# Patient Record
Sex: Female | Born: 1980 | Hispanic: Yes | Marital: Married | Smoking: Never smoker
Health system: Southern US, Community
[De-identification: ages and names within clinical notes are randomized; demographics above are authoritative.]

## PROBLEM LIST (undated history)

## (undated) DIAGNOSIS — E559 Vitamin D deficiency, unspecified: Secondary | ICD-10-CM

## (undated) DIAGNOSIS — D500 Iron deficiency anemia secondary to blood loss (chronic): Secondary | ICD-10-CM

## (undated) DIAGNOSIS — R7303 Prediabetes: Secondary | ICD-10-CM

## (undated) DIAGNOSIS — K219 Gastro-esophageal reflux disease without esophagitis: Secondary | ICD-10-CM

## (undated) DIAGNOSIS — G4733 Obstructive sleep apnea (adult) (pediatric): Secondary | ICD-10-CM

## (undated) DIAGNOSIS — F32A Depression, unspecified: Secondary | ICD-10-CM

## (undated) DIAGNOSIS — E039 Hypothyroidism, unspecified: Secondary | ICD-10-CM

## (undated) DIAGNOSIS — G43909 Migraine, unspecified, not intractable, without status migrainosus: Secondary | ICD-10-CM

## (undated) DIAGNOSIS — F419 Anxiety disorder, unspecified: Secondary | ICD-10-CM

## (undated) DIAGNOSIS — D259 Leiomyoma of uterus, unspecified: Secondary | ICD-10-CM

## (undated) DIAGNOSIS — M5412 Radiculopathy, cervical region: Secondary | ICD-10-CM

## (undated) HISTORY — PX: COLONOSCOPY: SHX174

---

## 2004-11-26 HISTORY — PX: CHOLECYSTECTOMY: SHX55

## 2012-11-26 HISTORY — PX: DILATION AND CURETTAGE OF UTERUS: SHX78

## 2013-07-22 ENCOUNTER — Emergency Department: Payer: Self-pay | Admitting: Internal Medicine

## 2013-07-22 LAB — CBC
HCT: 34.9 % — ABNORMAL LOW (ref 35.0–47.0)
HGB: 11.8 g/dL — ABNORMAL LOW (ref 12.0–16.0)
MCH: 26.6 pg (ref 26.0–34.0)
MCHC: 33.9 g/dL (ref 32.0–36.0)
MCV: 79 fL — ABNORMAL LOW (ref 80–100)
RBC: 4.44 10*6/uL (ref 3.80–5.20)
RDW: 15.1 % — ABNORMAL HIGH (ref 11.5–14.5)
WBC: 10.7 10*3/uL (ref 3.6–11.0)

## 2013-07-22 LAB — COMPREHENSIVE METABOLIC PANEL
Albumin: 3.3 g/dL — ABNORMAL LOW (ref 3.4–5.0)
Alkaline Phosphatase: 65 U/L (ref 50–136)
Calcium, Total: 8.7 mg/dL (ref 8.5–10.1)
EGFR (Non-African Amer.): >60
Osmolality: 268 (ref 275–301)
Potassium: 3.7 mmol/L (ref 3.5–5.1)
Sodium: 134 mmol/L — ABNORMAL LOW (ref 136–145)

## 2013-07-22 LAB — URINALYSIS, COMPLETE
Blood: NEGATIVE
Ketone: NEGATIVE
Ph: 6 (ref 4.5–8.0)
Protein: 30
RBC,UR: 6 /HPF (ref 0–5)
Specific Gravity: 1.027 (ref 1.003–1.030)

## 2013-07-22 LAB — HCG, QUANTITATIVE, PREGNANCY: Beta Hcg, Quant.: 53313 m[IU]/mL — ABNORMAL HIGH

## 2013-07-24 LAB — URINE CULTURE

## 2013-08-17 ENCOUNTER — Encounter: Payer: Self-pay | Admitting: Obstetrics and Gynecology

## 2013-09-07 ENCOUNTER — Encounter: Payer: Self-pay | Admitting: Maternal and Fetal Medicine

## 2013-09-30 ENCOUNTER — Emergency Department: Payer: Self-pay | Admitting: Emergency Medicine

## 2013-09-30 LAB — CBC
HGB: 10.5 g/dL — ABNORMAL LOW (ref 12.0–16.0)
MCHC: 34.7 g/dL (ref 32.0–36.0)
MCV: 80 fL (ref 80–100)
Platelet: 305 10*3/uL (ref 150–440)
RDW: 13.4 % (ref 11.5–14.5)

## 2013-09-30 LAB — COMPREHENSIVE METABOLIC PANEL
Albumin: 2.6 g/dL — ABNORMAL LOW (ref 3.4–5.0)
Anion Gap: 7 (ref 7–16)
BUN: 10 mg/dL (ref 7–18)
Bilirubin,Total: 0.2 mg/dL (ref 0.2–1.0)
Co2: 24 mmol/L (ref 21–32)
EGFR (African American): >60
Osmolality: 272 (ref 275–301)
Potassium: 3.4 mmol/L — ABNORMAL LOW (ref 3.5–5.1)
SGOT(AST): 22 U/L (ref 15–37)
SGPT (ALT): 30 U/L (ref 12–78)
Sodium: 136 mmol/L (ref 136–145)

## 2013-09-30 LAB — URINALYSIS, COMPLETE
Glucose,UR: 50 mg/dL (ref 0–75)
Nitrite: NEGATIVE
Protein: NEGATIVE
WBC UR: 1 /HPF (ref 0–5)

## 2013-09-30 LAB — LIPASE, BLOOD: Lipase: 116 U/L (ref 73–393)

## 2013-10-01 LAB — GC/CHLAMYDIA PROBE AMP

## 2013-10-01 LAB — WET PREP, GENITAL

## 2013-10-03 LAB — URINE CULTURE

## 2013-10-05 ENCOUNTER — Encounter: Payer: Self-pay | Admitting: Maternal and Fetal Medicine

## 2013-10-19 ENCOUNTER — Encounter: Payer: Self-pay | Admitting: Maternal and Fetal Medicine

## 2014-11-04 ENCOUNTER — Emergency Department: Payer: Self-pay | Admitting: Emergency Medicine

## 2014-11-26 HISTORY — PX: TUBAL LIGATION: SHX77

## 2015-03-18 NOTE — Consult Note (Signed)
Referral Information:  Reason for Referral Twin gestation with  right ovarian mass and family h/o ovarian cancer   Referring Physician Dr Glennon Mac, [facility]   Prenatal Hx 34 yo G3 p2002 latina engaged female at 44 w 5 d by 6 weeks scan with twins and right complex ovarian mass . First scan suspicious for Mono/Mono , f/u at [facility] c/w Mono /di as is scan today .  Scan today has a normal appearing right ovary with a small cyst that appears c/wcorpus luteum cyst. Pregnancy is an accepted surprise. She has had nausea coping with Diclegis and Zofran. Migraine yesterday took ibuprofen , constipation uses miralax   Past Obstetrical Hx 2001 spontaneous vaginal delivery female 7+ lbs 2006 spontaneous vaginal delivery female 8 lbs - GB surgery at about 20 weeks   Home Medications: Medication Instructions Status  Diclegis 10 mg-10 mg oral delayed release tablet 1  orally once a day Active   Allergies:   No Known Allergies:   Vital Signs/Notes:  Nursing Vital Signs: **Vital Signs.:   22-Sep-14 11:37  Vital Signs Type Routine; in Perinatal Clinic  Temperature Temperature (F) 98.2  Celsius 36.7  Pulse Pulse 91  Respirations Respirations 18  Systolic BP Systolic BP 259  Diastolic BP (mmHg) Diastolic BP (mmHg) 69  Mean BP 86  Pulse Ox % Pulse Ox % 98  Oxygen Delivery Room Air/ 21 %   Perinatal Consult:  Past Medical History cont'd migraines - one per year - had one yesterday - uses single pill outside of pregnancy , immetrex? 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 I reviewed increased risk of twins including preterm delivery, growth issues, hyperemesis , delivery issues ,preeeclampsia and twin to twin transfusion syndrome . 2 Desires first trimester screen - ordered here in 3 weeks (though may be done at primary ob ) 3 Q 2 week scans for TTTS 16 to 32 weeks  4 Q 4week growth scans in third trimester to assess growth . 5 Antenatal testing at 32-34 weeks  6 Delivery a t 37-38 w if stable   Plan:  Genetic Counseling yes, with NT   Prenatal Diagnosis Options First trimester, Level II [place]   Ultrasound at what gestational ages Monthly > 28 weeks, plus q 2weeks 16-32 weeks   Antepartum Testing Starting at 32 weeks, Weekly   Delivery Mode depends on position   Additional Testing Folate/prenatal vitamins   Delivery at what gestational age 25-38    Total Time Spent with Patient 30 minutes   >50% of visit spent in couseling/coordination of care yes   Office Use Only [address]  Level 2 (86min) NEW office consult exp prob focused   Coding Description: MULTIPLE GESTATION INDICATION(S).   Twin gestation: mono/di.  Electronic Signatures: Lehman Prom,  Micah Noel (MD)  (Signed 22-Sep-14 15:58)  Authored: Referral, Home Medications, Allergies, Vital Signs/Notes, Consult, Exam, Impression, Plan, Billing, Coding Description   Last Updated: 22-Sep-14 15:58 by Sharyn Creamer (MD)

## 2016-11-26 HISTORY — PX: ESOPHAGOGASTRODUODENOSCOPY: SHX1529

## 2017-04-18 ENCOUNTER — Ambulatory Visit: Payer: BLUE CROSS/BLUE SHIELD | Admitting: Family Medicine

## 2017-05-21 ENCOUNTER — Ambulatory Visit: Payer: BLUE CROSS/BLUE SHIELD | Admitting: Family Medicine

## 2018-07-11 ENCOUNTER — Ambulatory Visit
Admission: RE | Admit: 2018-07-11 | Discharge: 2018-07-11 | Disposition: A | Discharge status: Home / Self Care | Payer: BLUE CROSS/BLUE SHIELD | Source: Ambulatory Visit | Attending: Adult Health | Admitting: Adult Health

## 2018-07-11 ENCOUNTER — Ambulatory Visit
Admission: RE | Admit: 2018-07-11 | Discharge: 2018-07-11 | Disposition: A | Discharge status: Home / Self Care | Payer: BLUE CROSS/BLUE SHIELD | Source: Ambulatory Visit | Attending: Family Medicine | Admitting: Family Medicine

## 2018-07-11 ENCOUNTER — Other Ambulatory Visit: Payer: Self-pay | Admitting: Adult Health

## 2018-07-11 DIAGNOSIS — M129 Arthropathy, unspecified: Secondary | ICD-10-CM | POA: Insufficient documentation

## 2018-07-11 DIAGNOSIS — M5440 Lumbago with sciatica, unspecified side: Secondary | ICD-10-CM

## 2018-07-11 DIAGNOSIS — M545 Low back pain: Secondary | ICD-10-CM | POA: Diagnosis not present

## 2018-07-11 DIAGNOSIS — M419 Scoliosis, unspecified: Secondary | ICD-10-CM | POA: Diagnosis not present

## 2018-07-11 DIAGNOSIS — M5430 Sciatica, unspecified side: Secondary | ICD-10-CM | POA: Diagnosis not present

## 2018-08-20 ENCOUNTER — Ambulatory Visit
Admission: RE | Admit: 2018-08-20 | Discharge: 2018-08-20 | Disposition: A | Discharge status: Home / Self Care | Payer: BLUE CROSS/BLUE SHIELD | Source: Ambulatory Visit | Attending: Family Medicine | Admitting: Family Medicine

## 2018-08-20 ENCOUNTER — Other Ambulatory Visit: Payer: Self-pay | Admitting: Family Medicine

## 2018-08-20 DIAGNOSIS — M4722 Other spondylosis with radiculopathy, cervical region: Secondary | ICD-10-CM | POA: Insufficient documentation

## 2018-08-20 DIAGNOSIS — M5412 Radiculopathy, cervical region: Secondary | ICD-10-CM

## 2018-08-20 DIAGNOSIS — E039 Hypothyroidism, unspecified: Secondary | ICD-10-CM | POA: Insufficient documentation

## 2019-03-19 ENCOUNTER — Other Ambulatory Visit: Payer: Self-pay | Admitting: Student

## 2019-03-19 DIAGNOSIS — M5412 Radiculopathy, cervical region: Secondary | ICD-10-CM

## 2019-08-30 ENCOUNTER — Emergency Department: Payer: BC Managed Care – PPO

## 2019-08-30 ENCOUNTER — Other Ambulatory Visit: Payer: Self-pay

## 2019-08-30 ENCOUNTER — Encounter: Payer: Self-pay | Admitting: Emergency Medicine

## 2019-08-30 ENCOUNTER — Emergency Department
Admission: EM | Admit: 2019-08-30 | Discharge: 2019-08-30 | Disposition: A | Discharge status: Home / Self Care | Payer: BC Managed Care – PPO | Attending: Emergency Medicine | Admitting: Emergency Medicine

## 2019-08-30 DIAGNOSIS — J189 Pneumonia, unspecified organism: Secondary | ICD-10-CM

## 2019-08-30 DIAGNOSIS — J181 Lobar pneumonia, unspecified organism: Secondary | ICD-10-CM | POA: Diagnosis not present

## 2019-08-30 DIAGNOSIS — U071 COVID-19: Secondary | ICD-10-CM | POA: Diagnosis not present

## 2019-08-30 LAB — TROPONIN I (HIGH SENSITIVITY): Troponin I (High Sensitivity): 3 ng/L (ref <18)

## 2019-08-30 LAB — CBC WITH DIFFERENTIAL/PLATELET
Abs Immature Granulocytes: 0.02 10*3/uL (ref 0.00–0.07)
Basophils Absolute: 0 10*3/uL (ref 0.0–0.1)
Basophils Relative: 0 %
Eosinophils Absolute: 0.1 10*3/uL (ref 0.0–0.5)
Eosinophils Relative: 1 %
HCT: 38.3 % (ref 36.0–46.0)
Hemoglobin: 12.8 g/dL (ref 12.0–15.0)
Immature Granulocytes: 0 %
Lymphocytes Relative: 35 %
Lymphs Abs: 2.1 10*3/uL (ref 0.7–4.0)
MCH: 27.5 pg (ref 26.0–34.0)
MCHC: 33.4 g/dL (ref 30.0–36.0)
MCV: 82.2 fL (ref 80.0–100.0)
Monocytes Absolute: 0.6 10*3/uL (ref 0.1–1.0)
Monocytes Relative: 11 %
Neutro Abs: 3.2 10*3/uL (ref 1.7–7.7)
Neutrophils Relative %: 53 %
Platelets: 288 10*3/uL (ref 150–400)
RBC: 4.66 MIL/uL (ref 3.87–5.11)
RDW: 12.2 % (ref 11.5–15.5)
WBC: 6 10*3/uL (ref 4.0–10.5)
nRBC: 0 % (ref 0.0–0.2)

## 2019-08-30 LAB — COMPREHENSIVE METABOLIC PANEL
ALT: 21 U/L (ref 0–44)
AST: 20 U/L (ref 15–41)
Albumin: 4.2 g/dL (ref 3.5–5.0)
Alkaline Phosphatase: 64 U/L (ref 38–126)
Anion gap: 11 (ref 5–15)
BUN: 11 mg/dL (ref 6–20)
CO2: 25 mmol/L (ref 22–32)
Calcium: 9.2 mg/dL (ref 8.9–10.3)
Chloride: 101 mmol/L (ref 98–111)
Creatinine, Ser: 0.74 mg/dL (ref 0.44–1.00)
GFR calc Af Amer: >60 mL/min (ref >60)
GFR calc non Af Amer: >60 mL/min (ref >60)
Glucose, Bld: 91 mg/dL (ref 70–99)
Potassium: 3.8 mmol/L (ref 3.5–5.1)
Sodium: 137 mmol/L (ref 135–145)
Total Bilirubin: 0.7 mg/dL (ref 0.3–1.2)
Total Protein: 8.1 g/dL (ref 6.5–8.1)

## 2019-08-30 MED ORDER — AZITHROMYCIN 250 MG PO TABS: ORAL_TABLET | ORAL | 0 refills | Status: DC

## 2019-08-30 MED ORDER — METHYLPREDNISOLONE 4 MG PO TBPK: ORAL_TABLET | ORAL | 0 refills | Status: DC

## 2019-08-30 NOTE — ED Triage Notes (Signed)
Pt to ED via POV stating that she is COVID positive, pt states that she is having shortness of breath and some pain in her chest. Pt is in NAD, no respiratory distress is noted, pt is speaking in complete sentences without difficulty. SpO2 99% on room air.

## 2019-08-30 NOTE — ED Provider Notes (Signed)
[facility] Emergency Department Provider Note  ____________________________________________   First MD Initiated Contact with Patient 08/30/19 1124     (approximate)  I have reviewed the triage vital signs and the nursing notes.   HISTORY  Chief Complaint COVID    HPI Valerie Pearson is a 38 y.o. female presents emergency department stating she tested positive for covid on August 20, 2019.  She states that she has now started to have some chest pain and shortness of breath associated with COVID.  She denies any nausea or vomiting.  No swelling in the extremities.    History reviewed. No pertinent past medical history.  There are no active problems to display for this patient.   History reviewed. No pertinent surgical history.  Prior to Admission medications   Medication Sig Start Date End Date Taking? Authorizing Provider  azithromycin (ZITHROMAX Z-PAK) 250 MG tablet 2 pills today then 1 pill a day for 4 days 08/30/19   Caryn Section Linden Dolin, PA-C  methylPREDNISolone (MEDROL DOSEPAK) 4 MG TBPK tablet Take 6 pills on day one then decrease by 1 pill each day 08/30/19   Versie Starks, PA-C    Allergies Iodinated diagnostic agents  No family history on file.  Social History Social History   Tobacco Use  . Smoking status: Not on file  Substance Use Topics  . Alcohol use: Not on file  . Drug use: Not on file    Review of Systems  Constitutional: No fever/chills Eyes: No visual changes. ENT: No sore throat. Respiratory: Positive cough Cardiovascular: Positive chest pain shortness of breath Genitourinary: Negative for dysuria. Musculoskeletal: Negative for back pain. Skin: Negative for rash.    ____________________________________________   PHYSICAL EXAM:  VITAL SIGNS: ED Triage Vitals  Enc Vitals Group     BP 08/30/19 1056 115/72     Pulse Rate 08/30/19 1056 89     Resp 08/30/19 1056 16     Temp 08/30/19 1056 98.7 F (37.1  C)     Temp Source 08/30/19 1056 Oral     SpO2 08/30/19 1056 99 %     Weight 08/30/19 1057 158 lb (71.7 kg)     Height 08/30/19 1057 5' 1"  (1.549 m)     Head Circumference --      Peak Flow --      Pain Score 08/30/19 1057 0     Pain Loc --      Pain Edu? --      Excl. in [employer]? --     Constitutional: Alert and oriented. Well appearing and in no acute distress. Eyes: Conjunctivae are normal.  Head: Atraumatic. Nose: No congestion/rhinnorhea. Mouth/Throat: Mucous membranes are moist.   Neck:  supple no lymphadenopathy noted Cardiovascular: Normal rate, regular rhythm. Heart sounds are normal Respiratory: Normal respiratory effort.  No retractions, lungs c t a  GU: deferred Musculoskeletal: FROM all extremities, warm and well perfused Neurologic:  Normal speech and language.  Skin:  Skin is warm, dry and intact. No rash noted. Psychiatric: Mood and affect are normal. Speech and behavior are normal.  ____________________________________________   LABS (all labs ordered are listed, but only abnormal results are displayed)  Labs Reviewed  COMPREHENSIVE METABOLIC PANEL  CBC WITH DIFFERENTIAL/PLATELET  TROPONIN I (HIGH SENSITIVITY)   ____________________________________________   ____________________________________________  RADIOLOGY  Chest x-ray shows infiltrate/infection in the left lower lung base  ____________________________________________   PROCEDURES  Procedure(s) performed: EKG is normal sinus rhythm  Procedures    ____________________________________________  INITIAL IMPRESSION / ASSESSMENT AND PLAN / ED COURSE  Pertinent labs & imaging results that were available during my care of the patient were reviewed by me and considered in my medical decision making (see chart for details).   Patient is a 38 year old female presents emergency department complaining of chest pain/shortness of breath.  Patient test positive for COVID on August 20, 2019.  See  HPI  Physical exam patient's vitals are normal.  Patient appears nontoxic and is not in any acute respiratory distress.  Lungs are clear to all station.  Chest x-ray shows a left lung infection. CBC, met C, troponin, and EKG were ordered   CBC is normal, EKG shows normal sinus rhythm Troponin and metabolic panel are both normal  Explained all the findings to the patient.  Due to the infection noted in the left lung empirically treated her with a Z-Pak and steroid pack.  She is to follow-up with her regular doctor if not better in 3 days.  Return emergency department worsening.  She states she understands will comply.  She is discharged in stable condition.  Dayne Dekay was evaluated in Emergency Department on 08/30/2019 for the symptoms described in the history of present illness. She was evaluated in the context of the global COVID-19 pandemic, which necessitated consideration that the patient might be at risk for infection with the SARS-CoV-2 virus that causes COVID-19. Institutional protocols and algorithms that pertain to the evaluation of patients at risk for COVID-19 are in a state of rapid change based on information released by regulatory bodies including the CDC and federal and state organizations. These policies and algorithms were followed during the patient's care in the ED.   As part of my medical decision making, I reviewed the following data within the [place] notes reviewed and incorporated, Labs reviewed see above, EKG interpreted NSR, Old chart reviewed, Radiograph reviewed chest x-ray shows infection in the left lung, Notes from prior ED visits and [place] Controlled Substance Database  ____________________________________________   FINAL CLINICAL IMPRESSION(S) / ED DIAGNOSES  Final diagnoses:  COVID-19  Pneumonia of left lower lobe due to infectious organism      NEW MEDICATIONS STARTED DURING THIS VISIT:  Discharge Medication List as of  08/30/2019  2:55 PM    START taking these medications   Details  azithromycin (ZITHROMAX Z-PAK) 250 MG tablet 2 pills today then 1 pill a day for 4 days, Normal    methylPREDNISolone (MEDROL DOSEPAK) 4 MG TBPK tablet Take 6 pills on day one then decrease by 1 pill each day, Normal         Note:  This document was prepared using Dragon voice recognition software and may include unintentional dictation errors.    Versie Starks, PA-C 08/30/19 1550    Arta Silence, MD 08/30/19 1606

## 2019-08-30 NOTE — Discharge Instructions (Signed)
Follow-up with your regular doctor if not better in 3 days.  Return emergency department worsening.  All of your lab work, EKG were normal.  Your chest x-ray did show some infection in the left lung.  I have sent in a prescription to your pharmacy.  Please have this filled and take medication as prescribed.

## 2020-02-29 ENCOUNTER — Other Ambulatory Visit: Payer: Self-pay | Admitting: Family Medicine

## 2020-02-29 DIAGNOSIS — M79609 Pain in unspecified limb: Secondary | ICD-10-CM

## 2020-03-04 ENCOUNTER — Ambulatory Visit
Admission: RE | Admit: 2020-03-04 | Discharge: 2020-03-04 | Disposition: A | Discharge status: Home / Self Care | Payer: BC Managed Care – PPO | Source: Ambulatory Visit | Attending: Family Medicine | Admitting: Family Medicine

## 2020-03-04 ENCOUNTER — Other Ambulatory Visit: Payer: Self-pay

## 2020-03-04 DIAGNOSIS — M79609 Pain in unspecified limb: Secondary | ICD-10-CM | POA: Diagnosis not present

## 2020-04-02 IMAGING — CR DG LUMBAR SPINE COMPLETE W/ BEND
1 series · 7 of 7 positions shown · non-contrast
Comparison: None.

CLINICAL DATA: Back pain for 1 week.

EXAM:
LUMBAR SPINE - COMPLETE WITH BENDING VIEWS

[Series 1: dg lumbar spine complete w/bend 6+v · 0.14mm/px · 7 of 7 slices shown]
[im 1/7]
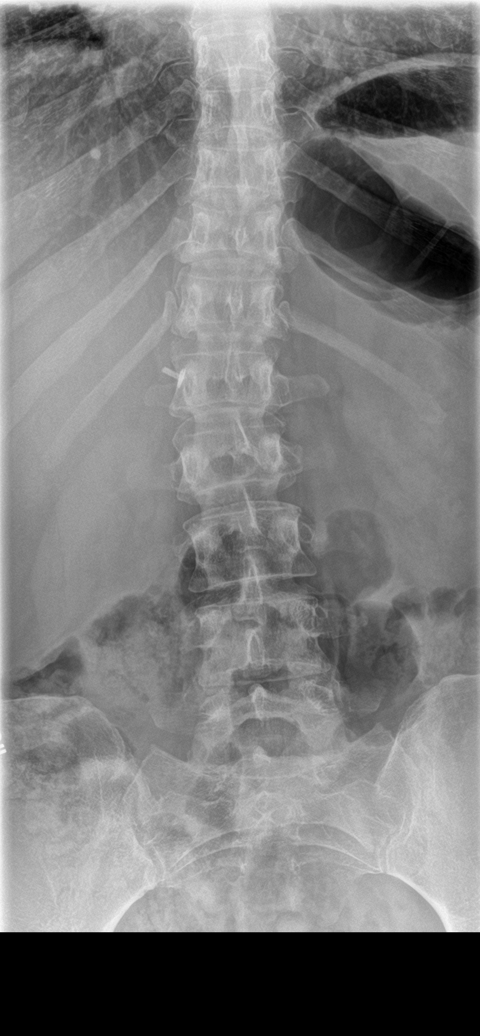
[im 2/7]
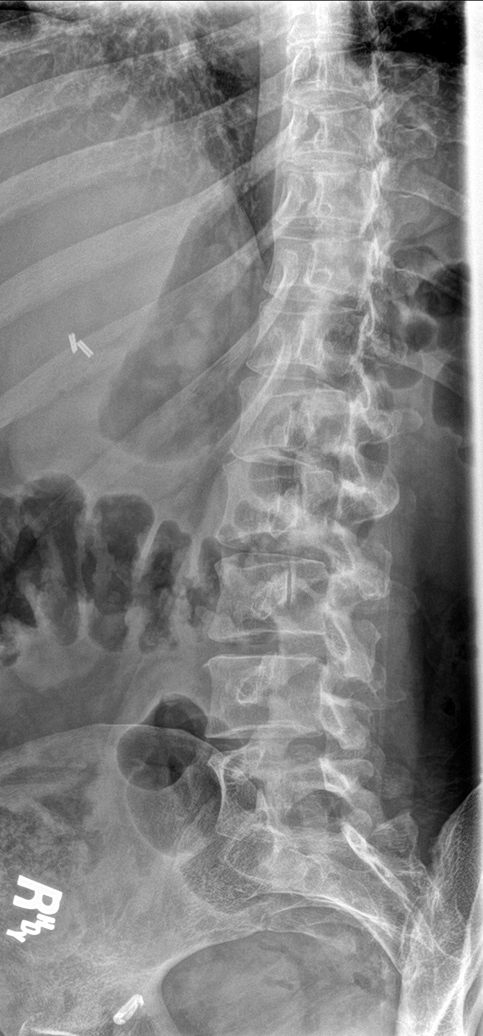
[im 3/7]
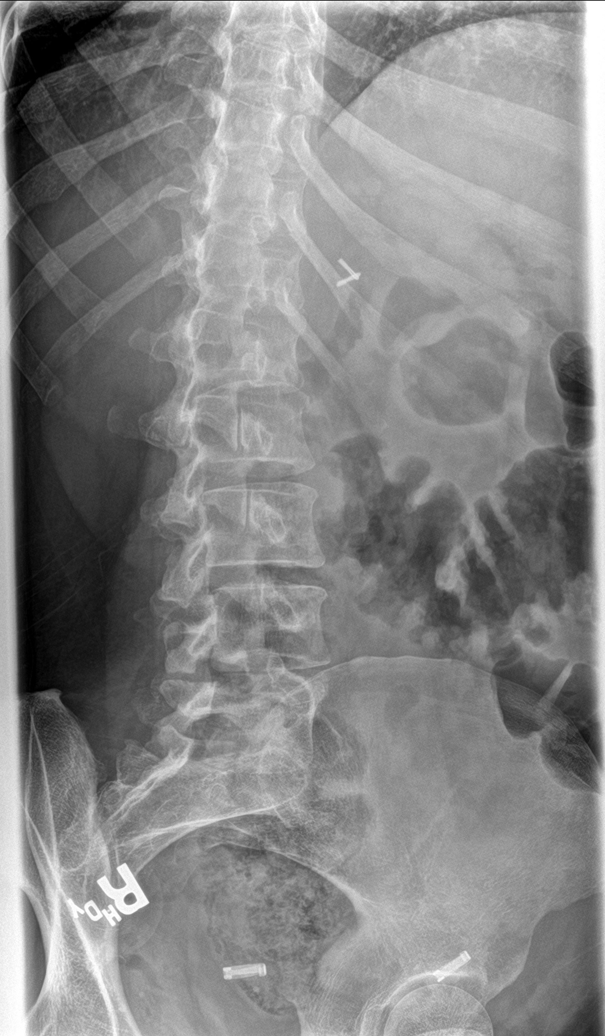
[im 4/7]
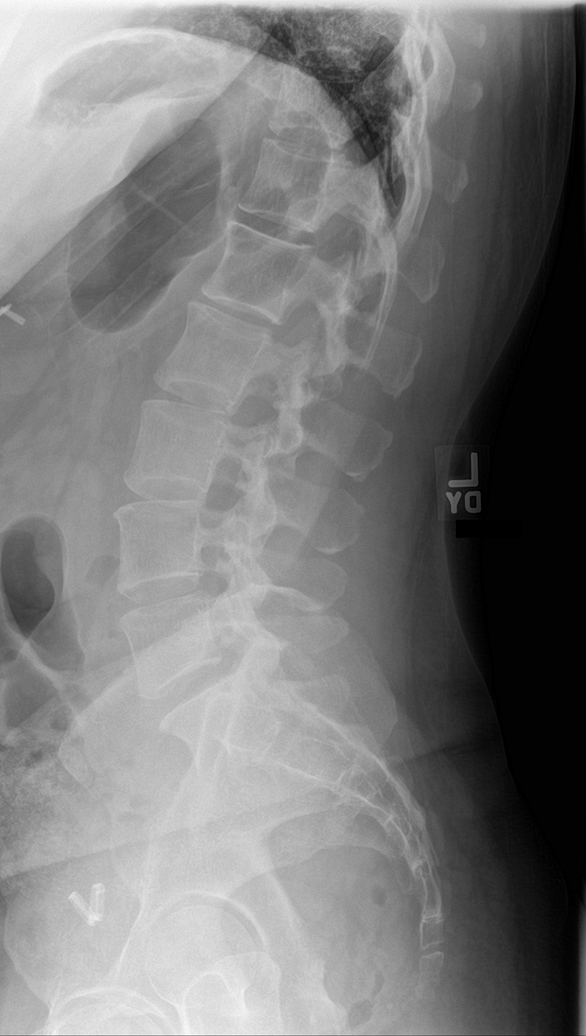
[im 5/7]
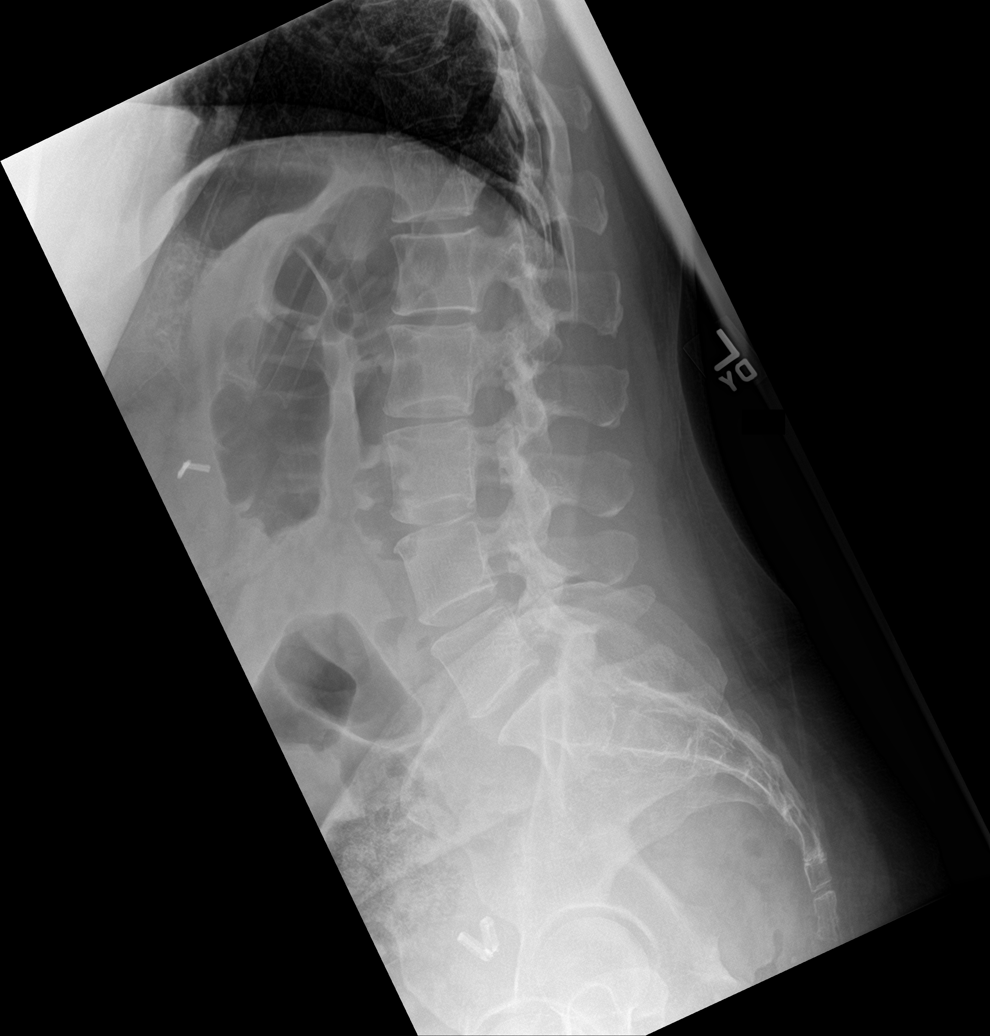
[im 6/7]
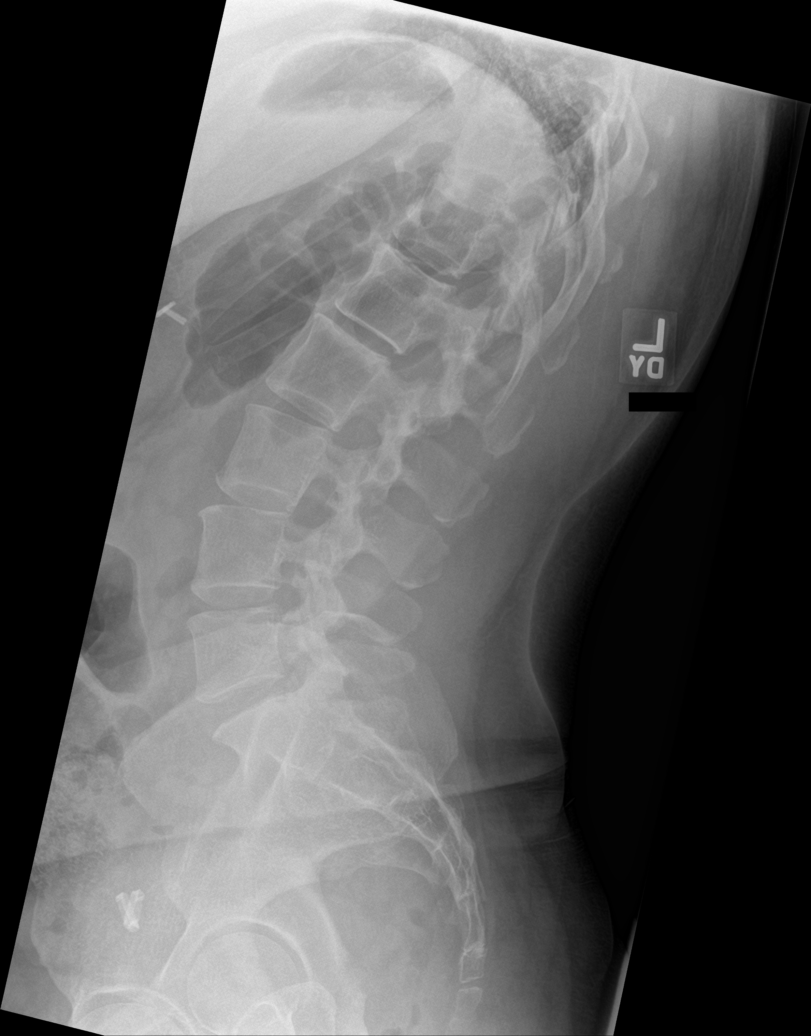
[im 7/7]
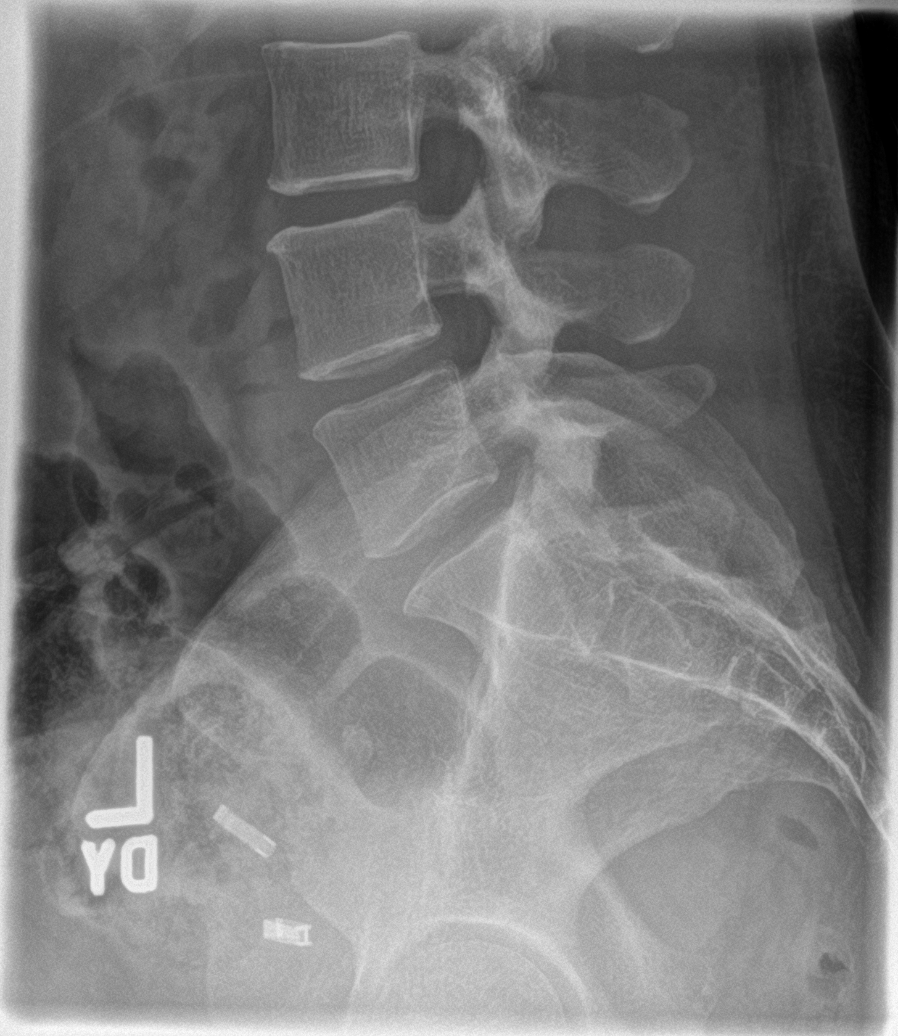

[7 of 7 positions shown; findings below may reference images not displayed]

FINDINGS: There is no evidence of lumbar spine fracture. Exaggerated lordosis
and mild dextroconvex scoliosis. Intervertebral disc spaces are
maintained. Mild posterior facet arthropathy in the lower
lumbosacral spine.

Tubal ligation and cholecystectomy clips noted.
IMPRESSION: Posterior facet arthropathy in the lower lumbosacral spine.

Exaggerated lordosis and mild dextroconvex scoliosis.

## 2020-05-12 IMAGING — CR DG CERVICAL SPINE 2 OR 3 VIEWS
4 series · 4 of 4 positions shown · non-contrast
Comparison: None.

CLINICAL DATA: Chronic neck pain

EXAM:
CERVICAL SPINE - 3 VIEW

[c-spine lat]
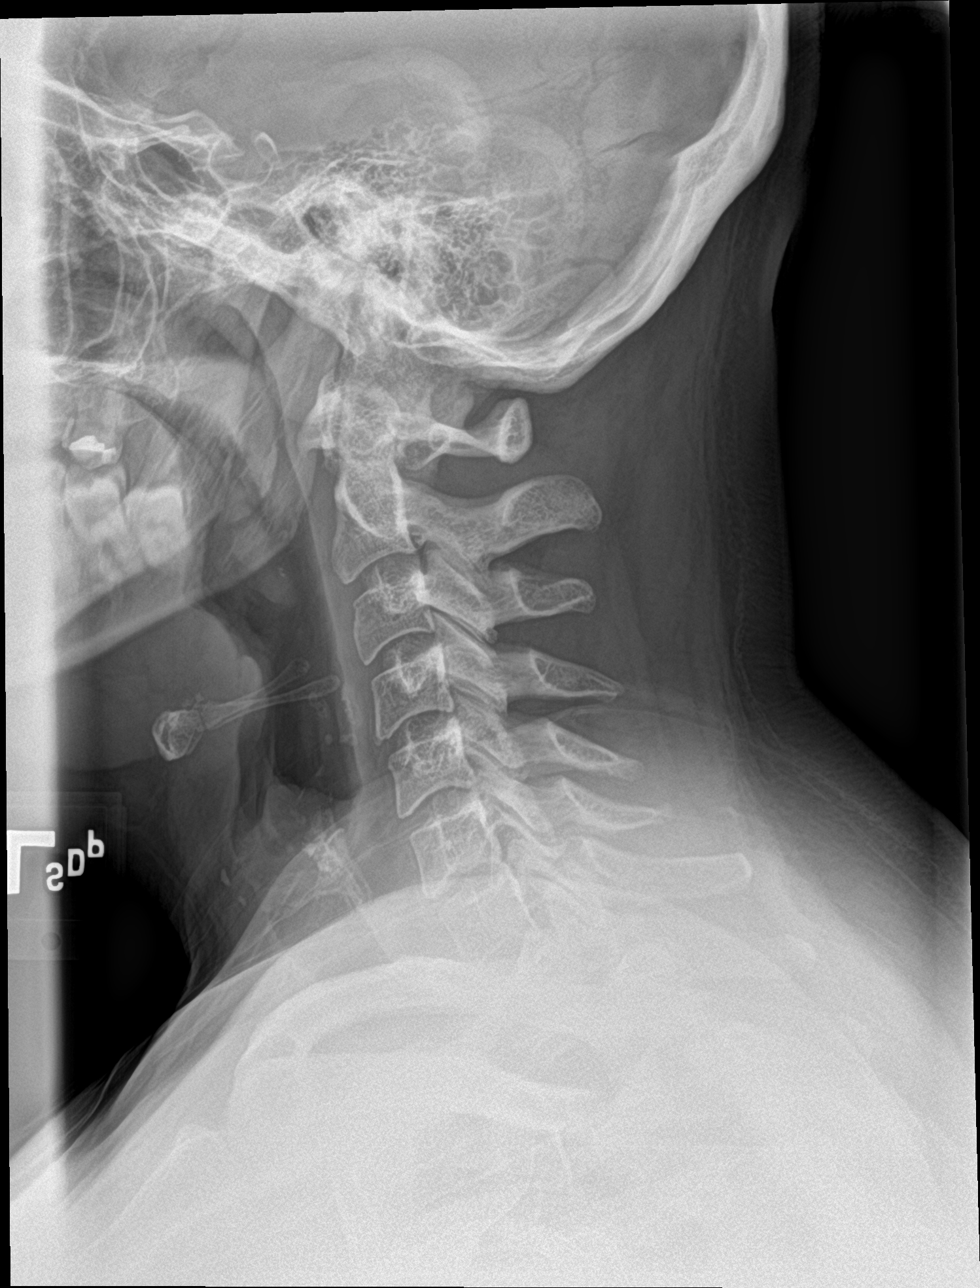

[c-spine open mouth (1 of 2)]
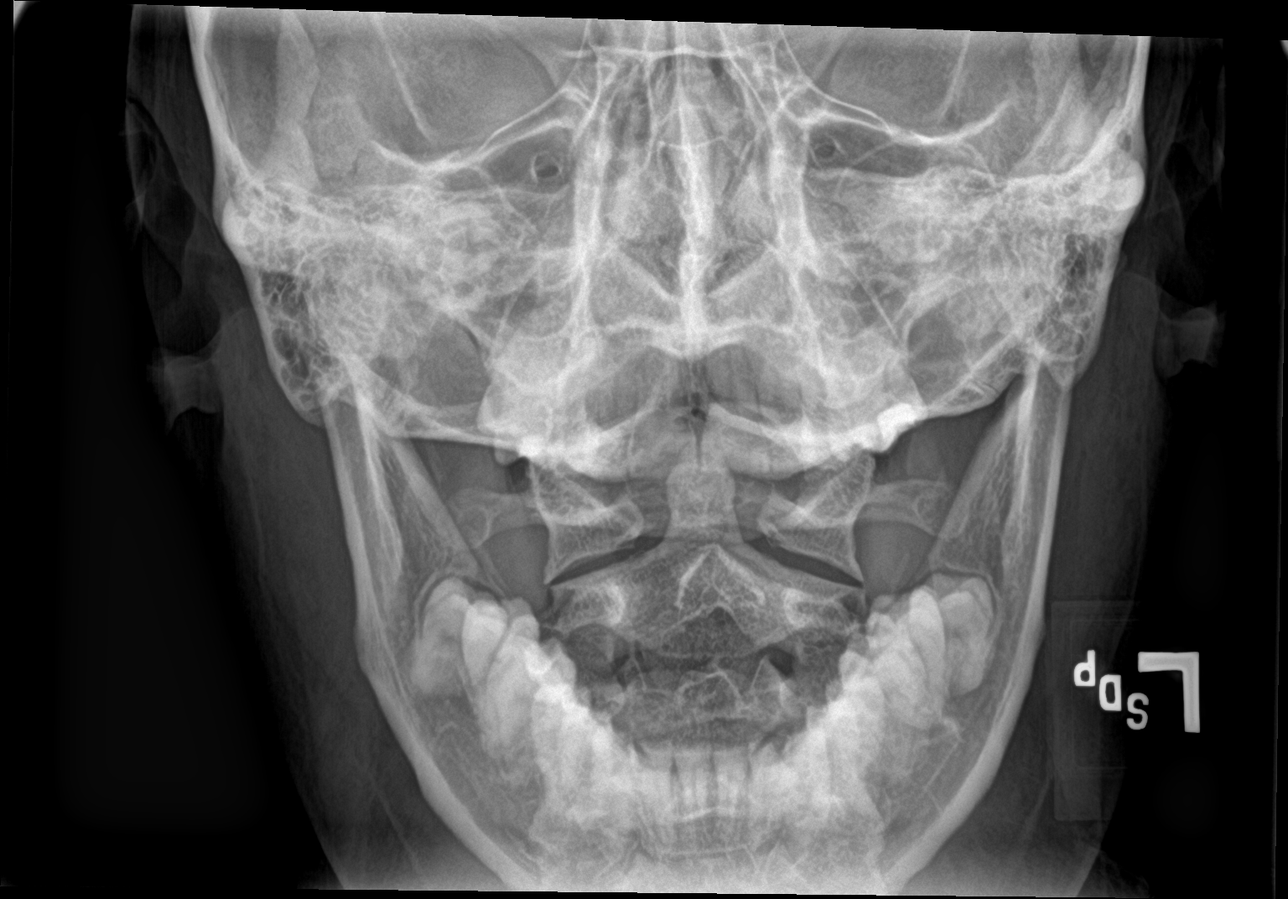

[c-spine open mouth (2 of 2)]
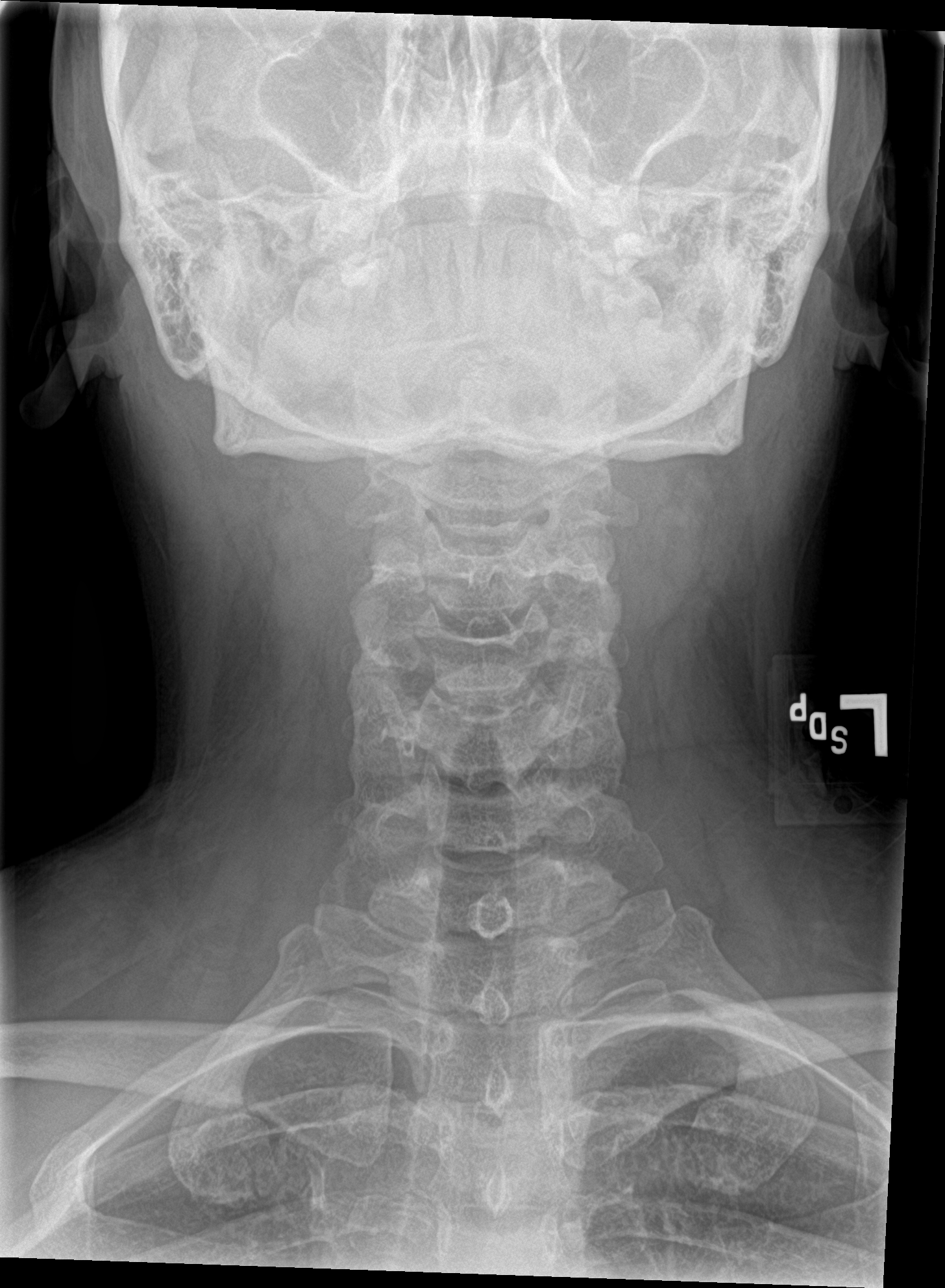

[c-spine swimmers]
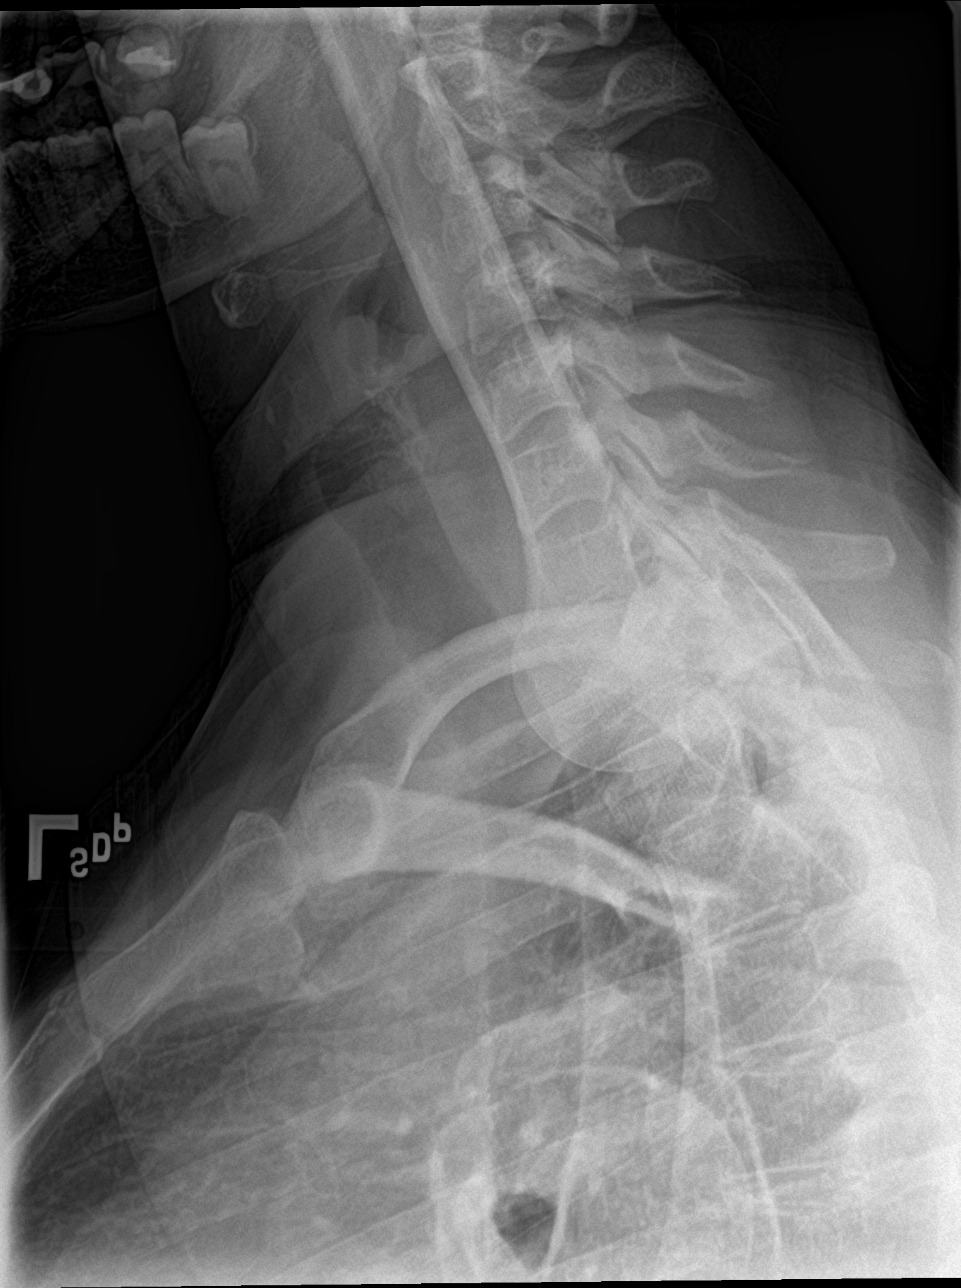

[4 of 4 positions shown; findings below may reference images not displayed]

FINDINGS: Seven cervical segments are well visualized. Minimal osteophytic
changes are noted at C4-5. The odontoid is within normal limits. No
acute fracture or acute facet abnormality is noted. No soft tissue
changes are seen.
IMPRESSION: No acute abnormality noted.  Mild degenerative changes are noted.

## 2020-05-18 ENCOUNTER — Other Ambulatory Visit: Payer: Self-pay | Admitting: Surgery

## 2020-06-02 ENCOUNTER — Other Ambulatory Visit: Payer: Self-pay

## 2020-06-02 ENCOUNTER — Other Ambulatory Visit
Admission: RE | Admit: 2020-06-02 | Discharge: 2020-06-02 | Disposition: A | Discharge status: Home / Self Care | Payer: BC Managed Care – PPO | Source: Ambulatory Visit | Attending: Surgery | Admitting: Surgery

## 2020-06-02 ENCOUNTER — Encounter: Payer: Self-pay | Admitting: *Deleted

## 2020-06-02 HISTORY — DX: Leiomyoma of uterus, unspecified: D25.9

## 2020-06-02 HISTORY — DX: Anxiety disorder, unspecified: F41.9

## 2020-06-02 HISTORY — DX: Migraine, unspecified, not intractable, without status migrainosus: G43.909

## 2020-06-02 HISTORY — DX: Depression, unspecified: F32.A

## 2020-06-02 HISTORY — DX: Hypothyroidism, unspecified: E03.9

## 2020-06-02 NOTE — Patient Instructions (Addendum)
COVID TESTING Date: Monday, July 12 Testing site:  [place] [address] Thru Hours:  1:75 am - 1:00 pm Once you are tested, you are asked to stay quarantined (avoiding public places) until after your surgery.   Your procedure is scheduled on: Wednesday, July 14 Report to Day Surgery on the 2nd floor of the [facility]. To find out your arrival time, please call [phone] between 1PM - 3PM on: Tuesday, July 13  REMEMBER: Instructions that are not followed completely may result in serious medical risk, up to and including death; or upon the discretion of your surgeon and anesthesiologist your surgery may need to be rescheduled.  Do not eat food after midnight the night before surgery.  No gum chewing, lozengers or hard candies.  You may however, drink CLEAR liquids up to 2 hours before you are scheduled to arrive for your surgery. Do not drink anything within 2 hours of your scheduled arrival time.  Clear liquids include: - water  - apple juice without pulp - gatorade (not RED) - black coffee or tea (Do NOT add milk or creamers to the coffee or tea) Do NOT drink anything that is not on this list.  ENSURE PRE-SURGERY CARBOHYDRATE DRINK:  Complete drinking 2 hours prior to scheduled arrival time.  DO NOT TAKE ANY MEDICATIONS THE MORNING OF SURGERY  Stop Anti-inflammatories (NSAIDS) such as Advil, Aleve, Ibuprofen, Motrin, Naproxen, Naprosyn and Aspirin based products such as Excedrin, Goodys Powder, BC Powder. (May take Tylenol or Acetaminophen if needed.)  Stop ANY OVER THE COUNTER supplements until after surgery.  No Alcohol for 24 hours before or after surgery.  On the morning of surgery brush your teeth with toothpaste and water, you may rinse your mouth with mouthwash if you wish. Do not swallow any toothpaste or mouthwash.  Do not wear jewelry, make-up, hairpins, clips or nail polish.  Do not wear lotions, powders, or perfumes.    Do not shave 48 hours prior to surgery.   Contact lenses may not be worn into surgery.  Do not bring valuables to the hospital. [facility] is not responsible for any missing/lost belongings or valuables.   Use CHG Soap as directed on instruction sheet.  Notify your doctor if there is any change in your medical condition (cold, fever, infection).  Wear comfortable clothing (specific to your surgery type) to the hospital.  Plan for stool softeners for home use; pain medications have a tendency to cause constipation. You can also help prevent constipation by eating foods high in fiber such as fruits and vegetables and drinking plenty of fluids as your diet allows.  After surgery, you can help prevent lung complications by doing breathing exercises.  Take deep breaths and cough every 1-2 hours. Your doctor may order a device called an Incentive Spirometer to help you take deep breaths.  If you are being discharged the day of surgery, you will not be allowed to drive home. You will need a responsible adult (18 years or older) to drive you home and stay with you that night.   If you are taking public transportation, you will need to have a responsible adult (18 years or older) with you. Please confirm with your physician that it is acceptable to use public transportation.   Please call the [place] Dept. at [phone] if you have any questions about these instructions.  Visitation Policy:  Patients undergoing a surgery or procedure may have one family member or  support person with them as long as that person is not COVID-19 positive or experiencing its symptoms.  That person may remain in the waiting area during the procedure.  As a reminder, masks are still required for all [place] team members, patients and visitors in all [facility] facilities.   Systemwide, no visitors 17 or younger.

## 2020-06-06 ENCOUNTER — Other Ambulatory Visit: Admission: RE | Admit: 2020-06-06 | Payer: BC Managed Care – PPO | Source: Ambulatory Visit

## 2020-06-07 ENCOUNTER — Other Ambulatory Visit
Admission: RE | Admit: 2020-06-07 | Discharge: 2020-06-07 | Disposition: A | Discharge status: Home / Self Care | Payer: BC Managed Care – PPO | Source: Ambulatory Visit | Attending: Surgery | Admitting: Surgery

## 2020-06-07 ENCOUNTER — Other Ambulatory Visit: Payer: Self-pay

## 2020-06-07 DIAGNOSIS — Z01812 Encounter for preprocedural laboratory examination: Secondary | ICD-10-CM | POA: Insufficient documentation

## 2020-06-07 DIAGNOSIS — Z20822 Contact with and (suspected) exposure to covid-19: Secondary | ICD-10-CM | POA: Insufficient documentation

## 2020-06-07 LAB — SARS CORONAVIRUS 2 (TAT 6-24 HRS): SARS Coronavirus 2: NEGATIVE

## 2020-06-07 MED ORDER — CEFAZOLIN SODIUM-DEXTROSE 2-4 GM/100ML-% IV SOLN
2.0000 g | INTRAVENOUS | Status: AC
  Administered 2020-06-08: 2 g via INTRAVENOUS

## 2020-06-08 ENCOUNTER — Encounter: Payer: Self-pay | Admitting: Surgery

## 2020-06-08 ENCOUNTER — Ambulatory Visit
Admission: RE | Admit: 2020-06-08 | Discharge: 2020-06-08 | Disposition: A | Discharge status: Home / Self Care | Payer: BC Managed Care – PPO | Attending: Surgery | Admitting: Surgery

## 2020-06-08 ENCOUNTER — Ambulatory Visit: Payer: BC Managed Care – PPO | Admitting: Anesthesiology

## 2020-06-08 ENCOUNTER — Encounter
Admission: RE | Disposition: A | Discharge status: Home / Self Care | Payer: Self-pay | Source: Home / Self Care | Attending: Surgery

## 2020-06-08 DIAGNOSIS — F329 Major depressive disorder, single episode, unspecified: Secondary | ICD-10-CM | POA: Diagnosis not present

## 2020-06-08 DIAGNOSIS — G4730 Sleep apnea, unspecified: Secondary | ICD-10-CM | POA: Insufficient documentation

## 2020-06-08 DIAGNOSIS — G5602 Carpal tunnel syndrome, left upper limb: Secondary | ICD-10-CM | POA: Diagnosis not present

## 2020-06-08 DIAGNOSIS — G43909 Migraine, unspecified, not intractable, without status migrainosus: Secondary | ICD-10-CM | POA: Diagnosis not present

## 2020-06-08 DIAGNOSIS — Z87891 Personal history of nicotine dependence: Secondary | ICD-10-CM | POA: Insufficient documentation

## 2020-06-08 DIAGNOSIS — Z79890 Hormone replacement therapy (postmenopausal): Secondary | ICD-10-CM | POA: Insufficient documentation

## 2020-06-08 DIAGNOSIS — F419 Anxiety disorder, unspecified: Secondary | ICD-10-CM | POA: Diagnosis not present

## 2020-06-08 DIAGNOSIS — E039 Hypothyroidism, unspecified: Secondary | ICD-10-CM | POA: Insufficient documentation

## 2020-06-08 DIAGNOSIS — Z79899 Other long term (current) drug therapy: Secondary | ICD-10-CM | POA: Insufficient documentation

## 2020-06-08 HISTORY — PX: CARPAL TUNNEL RELEASE: SHX101

## 2020-06-08 LAB — POCT PREGNANCY, URINE: Preg Test, Ur: NEGATIVE

## 2020-06-08 SURGERY — RELEASE, CARPAL TUNNEL, ENDOSCOPIC
Anesthesia: General | Site: Hand | Laterality: Left

## 2020-06-08 MED ORDER — ACETAMINOPHEN 10 MG/ML IV SOLN
INTRAVENOUS | Status: AC
  Filled 2020-06-08: qty 100

## 2020-06-08 MED ORDER — CHLORHEXIDINE GLUCONATE 0.12 % MT SOLN
OROMUCOSAL | Status: AC
  Filled 2020-06-08: qty 15

## 2020-06-08 MED ORDER — ONDANSETRON HCL 4 MG/2ML IJ SOLN
INTRAMUSCULAR | Status: DC | PRN
  Administered 2020-06-08: 4 mg via INTRAVENOUS

## 2020-06-08 MED ORDER — CHLORHEXIDINE GLUCONATE 0.12 % MT SOLN
15.0000 mL | Freq: Once | OROMUCOSAL | Status: AC
  Administered 2020-06-08: 15 mL via OROMUCOSAL

## 2020-06-08 MED ORDER — LACTATED RINGERS IV SOLN: INTRAVENOUS | Status: DC

## 2020-06-08 MED ORDER — PROPOFOL 10 MG/ML IV BOLUS
INTRAVENOUS | Status: AC
  Filled 2020-06-08: qty 20

## 2020-06-08 MED ORDER — ONDANSETRON HCL 4 MG/2ML IJ SOLN: 4.0000 mg | Freq: Once | INTRAMUSCULAR | Status: DC | PRN

## 2020-06-08 MED ORDER — BUPIVACAINE HCL (PF) 0.5 % IJ SOLN
INTRAMUSCULAR | Status: DC | PRN
  Administered 2020-06-08: 10 mL

## 2020-06-08 MED ORDER — BUPIVACAINE HCL (PF) 0.5 % IJ SOLN
INTRAMUSCULAR | Status: AC
  Filled 2020-06-08: qty 30

## 2020-06-08 MED ORDER — LIDOCAINE HCL (CARDIAC) PF 100 MG/5ML IV SOSY
PREFILLED_SYRINGE | INTRAVENOUS | Status: DC | PRN
  Administered 2020-06-08: 100 mg via INTRAVENOUS

## 2020-06-08 MED ORDER — PROPOFOL 10 MG/ML IV BOLUS
INTRAVENOUS | Status: DC | PRN
  Administered 2020-06-08: 200 mg via INTRAVENOUS

## 2020-06-08 MED ORDER — ONDANSETRON HCL 4 MG/2ML IJ SOLN
INTRAMUSCULAR | Status: AC
  Filled 2020-06-08: qty 2

## 2020-06-08 MED ORDER — DEXAMETHASONE SODIUM PHOSPHATE 10 MG/ML IJ SOLN
INTRAMUSCULAR | Status: DC | PRN
  Administered 2020-06-08: 10 mg via INTRAVENOUS

## 2020-06-08 MED ORDER — ORAL CARE MOUTH RINSE: 15.0000 mL | Freq: Once | OROMUCOSAL | Status: AC

## 2020-06-08 MED ORDER — PROPOFOL 500 MG/50ML IV EMUL
INTRAVENOUS | Status: AC
  Filled 2020-06-08: qty 50

## 2020-06-08 MED ORDER — FAMOTIDINE 20 MG PO TABS
20.0000 mg | ORAL_TABLET | Freq: Once | ORAL | Status: AC
  Administered 2020-06-08: 20 mg via ORAL

## 2020-06-08 MED ORDER — CEFAZOLIN SODIUM-DEXTROSE 2-4 GM/100ML-% IV SOLN
INTRAVENOUS | Status: AC
  Filled 2020-06-08: qty 100

## 2020-06-08 MED ORDER — OXYCODONE HCL 5 MG/5ML PO SOLN: 5.0000 mg | Freq: Once | ORAL | Status: DC | PRN

## 2020-06-08 MED ORDER — FENTANYL CITRATE (PF) 100 MCG/2ML IJ SOLN
INTRAMUSCULAR | Status: AC
  Filled 2020-06-08: qty 2

## 2020-06-08 MED ORDER — DEXAMETHASONE SODIUM PHOSPHATE 10 MG/ML IJ SOLN
INTRAMUSCULAR | Status: AC
  Filled 2020-06-08: qty 1

## 2020-06-08 MED ORDER — PHENYLEPHRINE HCL (PRESSORS) 10 MG/ML IV SOLN
INTRAVENOUS | Status: AC
  Filled 2020-06-08: qty 1

## 2020-06-08 MED ORDER — FENTANYL CITRATE (PF) 100 MCG/2ML IJ SOLN: 25.0000 ug | INTRAMUSCULAR | Status: DC | PRN

## 2020-06-08 MED ORDER — FAMOTIDINE 20 MG PO TABS
ORAL_TABLET | ORAL | Status: AC
  Filled 2020-06-08: qty 1

## 2020-06-08 MED ORDER — FENTANYL CITRATE (PF) 100 MCG/2ML IJ SOLN
INTRAMUSCULAR | Status: DC | PRN
  Administered 2020-06-08 (×2): 50 ug via INTRAVENOUS

## 2020-06-08 MED ORDER — HYDROCODONE-ACETAMINOPHEN 5-325 MG PO TABS: 1.0000 | ORAL_TABLET | Freq: Four times a day (QID) | ORAL | 0 refills | Status: DC | PRN

## 2020-06-08 MED ORDER — OXYCODONE HCL 5 MG PO TABS: 5.0000 mg | ORAL_TABLET | Freq: Once | ORAL | Status: DC | PRN

## 2020-06-08 MED ORDER — ACETAMINOPHEN 10 MG/ML IV SOLN: 1000.0000 mg | Freq: Once | INTRAVENOUS | Status: DC | PRN

## 2020-06-08 MED ORDER — ACETAMINOPHEN 10 MG/ML IV SOLN
INTRAVENOUS | Status: DC | PRN
  Administered 2020-06-08: 1000 mg via INTRAVENOUS

## 2020-06-08 MED ORDER — MIDAZOLAM HCL 2 MG/2ML IJ SOLN
INTRAMUSCULAR | Status: AC
  Filled 2020-06-08: qty 2

## 2020-06-08 MED ORDER — LIDOCAINE HCL (PF) 2 % IJ SOLN
INTRAMUSCULAR | Status: AC
  Filled 2020-06-08: qty 5

## 2020-06-08 MED ORDER — MIDAZOLAM HCL 2 MG/2ML IJ SOLN
INTRAMUSCULAR | Status: DC | PRN
  Administered 2020-06-08: 2 mg via INTRAVENOUS

## 2020-06-08 SURGICAL SUPPLY — 31 items
BNDG COHESIVE 4X5 TAN STRL (GAUZE/BANDAGES/DRESSINGS) ×2 IMPLANT
BNDG ELASTIC 2X5.8 VLCR STR LF (GAUZE/BANDAGES/DRESSINGS) ×2 IMPLANT
BNDG ESMARK 4X12 TAN STRL LF (GAUZE/BANDAGES/DRESSINGS) ×2 IMPLANT
CANISTER SUCT 1200ML W/VALVE (MISCELLANEOUS) ×2 IMPLANT
CHLORAPREP W/TINT 26 (MISCELLANEOUS) ×2 IMPLANT
CORD BIP STRL DISP 12FT (MISCELLANEOUS) ×2 IMPLANT
COVER WAND RF STERILE (DRAPES) ×2 IMPLANT
CUFF TOURN SGL QUICK 18X4 (TOURNIQUET CUFF) ×2 IMPLANT
DRAPE SURG 17X11 SM STRL (DRAPES) ×2 IMPLANT
FORCEPS JEWEL BIP 4-3/4 STR (INSTRUMENTS) ×2 IMPLANT
GAUZE SPONGE 4X4 12PLY STRL (GAUZE/BANDAGES/DRESSINGS) ×2 IMPLANT
GAUZE XEROFORM 1X8 LF (GAUZE/BANDAGES/DRESSINGS) ×2 IMPLANT
GLOVE BIO SURGEON STRL SZ8 (GLOVE) ×2 IMPLANT
GLOVE INDICATOR 8.0 STRL GRN (GLOVE) ×2 IMPLANT
GOWN STRL REUS W/ TWL LRG LVL3 (GOWN DISPOSABLE) ×2 IMPLANT
GOWN STRL REUS W/ TWL XL LVL3 (GOWN DISPOSABLE) ×1 IMPLANT
GOWN STRL REUS W/TWL LRG LVL3 (GOWN DISPOSABLE) ×2
GOWN STRL REUS W/TWL XL LVL3 (GOWN DISPOSABLE) ×1
KIT CARPAL TUNNEL (MISCELLANEOUS) ×1
KIT ESCP INSRT D SLOT CANN KN (MISCELLANEOUS) ×1 IMPLANT
KIT TURNOVER KIT A (KITS) ×2 IMPLANT
NS IRRIG 500ML POUR BTL (IV SOLUTION) ×2 IMPLANT
PACK EXTREMITY (MISCELLANEOUS) ×2 IMPLANT
SPLINT WRIST LG LT TX990309 (SOFTGOODS) ×2 IMPLANT
SPLINT WRIST LG RT TX900304 (SOFTGOODS) IMPLANT
SPLINT WRIST M LT TX990308 (SOFTGOODS) IMPLANT
SPLINT WRIST M RT TX990303 (SOFTGOODS) IMPLANT
SPLINT WRIST XL LT TX990310 (SOFTGOODS) IMPLANT
SPLINT WRIST XL RT TX990305 (SOFTGOODS) IMPLANT
STOCKINETTE IMPERVIOUS 9X36 MD (GAUZE/BANDAGES/DRESSINGS) ×2 IMPLANT
SUT PROLENE 4 0 PS 2 18 (SUTURE) ×2 IMPLANT

## 2020-06-08 NOTE — Op Note (Signed)
06/08/2020  8:38 AM  Patient:   Valerie Pearson  Pre-Op Diagnosis:   Left carpal tunnel syndrome.  Post-Op Diagnosis:   Same.  Procedure:   Endoscopic left carpal tunnel release.  Surgeon:   Pascal Lux, MD  Assistant:   Gabrielle Dare, PA-S  Anesthesia:   General LMA  Findings:   As above.  Complications:   None  EBL:   0 cc  Fluids:   1000 cc crystalloid  TT:   16 minutes at 250 mmHg  Drains:   None  Closure:   4-0 Prolene interrupted sutures  Brief Clinical Note:   The patient is a 39 year old female with a history of grossly worsening pain and paresthesias to her left hand. Her symptoms have persisted despite medications, activity modification, splinting, and an injection. Her history and examination are consistent with carpal tunnel syndrome. The patient presents at this time for an endoscopic left carpal tunnel release.   Procedure:   The patient was brought into the operating room and lain in the supine position. After adequate general laryngeal mask anesthesia was obtained, the left hand and upper extremity were prepped with ChloraPrep solution before being draped sterilely. Preoperative antibiotics were administered. A timeout was performed to verify the appropriate surgical site before the limb was exsanguinated with an Esmarch and the tourniquet inflated to 250 mmHg. An approximately 1.5-2 cm incision was made over the volar wrist flexion crease, centered over the palmaris longus tendon. The incision was carried down through the subcutaneous tissues with care taken to identify and protect any neurovascular structures. The distal forearm fascia was penetrated just proximal to the transverse carpal ligament. The soft tissues were released off the superficial and deep surfaces of the distal forearm fascia and this was released proximally for 3-4 cm under direct visualization.  Attention was directed distally. The Soil scientist was passed beneath the transverse  carpal ligament along the ulnar aspect of the carpal tunnel and used to release any adhesions as well as to remove any adherent synovial tissue before first the smaller then the larger of the two dilators were passed beneath the transverse carpal ligament along the ulnar margin of the carpal tunnel. The slotted cannula was introduced and the endoscope was placed into the slotted cannula and the undersurface of the transverse carpal ligament visualized. The distal margin of the transverse carpal ligament was marked by placing a 25-gauge needle percutaneously at LeRoy cardinal point so that it entered the distal portion of the slotted cannula. Under endoscopic visualization, the transverse carpal ligament was released from proximal to distal using the end-cutting blade. A second pass was performed to ensure complete release of the ligament. The adequacy of release was verified both endoscopically and by palpation using the freer elevator.  The wound was irrigated thoroughly with sterile saline solution before being closed using 4-0 Prolene interrupted sutures. A total of 10 cc of 0.5% plain Sensorcaine was injected in and around the incision before a sterile bulky dressing was applied to the wound. The patient was placed into a volar wrist splint before being awakened and returned to the recovery room in satisfactory condition after tolerating the procedure well.

## 2020-06-08 NOTE — Anesthesia Procedure Notes (Signed)
Procedure Name: LMA Insertion Performed by: Enis Riecke R, CRNA Pre-anesthesia Checklist: Patient identified, Emergency Drugs available, Suction available and Patient being monitored Patient Re-evaluated:Patient Re-evaluated prior to induction Oxygen Delivery Method: Circle system utilized Preoxygenation: Pre-oxygenation with 100% oxygen Induction Type: IV induction LMA: LMA inserted LMA Size: 3.0 Tube type: Oral Number of attempts: 1 Placement Confirmation: positive ETCO2 and breath sounds checked- equal and bilateral Dental Injury: Teeth and Oropharynx as per pre-operative assessment        

## 2020-06-08 NOTE — Anesthesia Preprocedure Evaluation (Signed)
Anesthesia Evaluation  Patient identified by MRN, date of birth, ID band Patient awake    Reviewed: Allergy & Precautions, NPO status , Patient's Chart, lab work & pertinent test results  History of Anesthesia Complications Negative for: history of anesthetic complications  Airway Mallampati: III  TM Distance: >3 FB Neck ROM: Full    Dental no notable dental hx. (+) Teeth Intact, Dental Advisory Given   Pulmonary sleep apnea , neg COPD, Patient abstained from smoking.Not current smoker,  Suspected OSA, in process of being worked up   Pulmonary exam normal breath sounds clear to auscultation       Cardiovascular Exercise Tolerance: Good METS(-) hypertension(-) CAD and (-) Past MI negative cardio ROS  (-) dysrhythmias  Rhythm:Regular Rate:Normal - Systolic murmurs    Neuro/Psych  Headaches, PSYCHIATRIC DISORDERS Anxiety Depression    GI/Hepatic neg GERD  ,(+)     (-) substance abuse  ,   Endo/Other  neg diabetesHypothyroidism   Renal/GU negative Renal ROS     Musculoskeletal   Abdominal   Peds  Hematology   Anesthesia Other Findings Past Medical History: No date: Anxiety No date: Depression No date: Hypothyroidism No date: Migraines No date: Uterine fibroid  Reproductive/Obstetrics                             Anesthesia Physical Anesthesia Plan  ASA: II  Anesthesia Plan: General   Post-op Pain Management:    Induction: Intravenous  PONV Risk Score and Plan: 3 and Ondansetron and Dexamethasone  Airway Management Planned: LMA  Additional Equipment: None  Intra-op Plan:   Post-operative Plan: Extubation in OR  Informed Consent: I have reviewed the patients History and Physical, chart, labs and discussed the procedure including the risks, benefits and alternatives for the proposed anesthesia with the patient or authorized representative who has indicated his/her  understanding and acceptance.     Dental advisory given  Plan Discussed with: CRNA and Surgeon  Anesthesia Plan Comments: (Discussed risks of anesthesia with patient, including PONV, sore throat, lip/dental damage. Rare risks discussed as well, such as cardiorespiratory and neurological sequelae. Patient understands.)        Anesthesia Quick Evaluation

## 2020-06-08 NOTE — Transfer of Care (Signed)
Immediate Anesthesia Transfer of Care Note  Patient: Kandis Cocking  Procedure(s) Performed: CARPAL TUNNEL RELEASE ENDOSCOPIC (Left Hand)  Patient Location: PACU  Anesthesia Type:General  Level of Consciousness: sedated  Airway & Oxygen Therapy: Patient Spontanous Breathing and Patient connected to face mask oxygen  Post-op Assessment: Report given to RN and Post -op Vital signs reviewed and stable  Post vital signs: Reviewed and stable  Last Vitals:  Vitals Value Taken Time  BP 126/94 06/08/20 0831  Temp 36.2 C 06/08/20 0831  Pulse 87 06/08/20 0833  Resp 11 06/08/20 0833  SpO2 95 % 06/08/20 0833  Vitals shown include unvalidated device data.  Last Pain:  Vitals:   06/08/20 0609  TempSrc: Temporal         Complications: No complications documented.

## 2020-06-08 NOTE — H&P (Signed)
History of Present Illness: Valerie Pearson is a 39 y.o. female with a history of bilateral hand pain and paresthesias, left more symptomatic than right. She states that the numbness and tingling is in the first 3 digits of both hands. She is wearing Velcro wrist splints at this time but she states that she is waking up at night due to the tingling and burning in her hands. She feels that she is dropping things. She reports 5 out of 10 pain score bilateral hands. She denies any numbness or tingling in the fourth or fifth digits to bilateral hands. She has not noticed any changes in the appearance of her hands. Her symptoms have persisted despite splinting, medications, activity modification, and an injection.   Past Medical History: . Anxiety  dx 2011  . Depression  dx 2011  . Fibroids  . Migraines  Dx at age 52  . Personal history of other specified diseases(V13.89)   Past Surgical History: . ABDOMINAL SURGERY  . CHOLECYSTECTOMY  2006  . COLONOSCOPY ~ 2013 [facility]  Colon Polyps per pt  . COLONOSCOPY 10/03/2017  [place] Colon Polyps in past: CBF 09/2022  . DILATION & CURRETTAGE N/A 10/13/2013  . DILATION & CURRETTAGE N/A 10/18/2013  . EGD 10/03/2017  Gastritis, H Pylori: No repeat per RTE  . LAPAROSCOPIC CAUTERIZATION FALLOPIAN TUBE Bilateral 06/14/2015  Procedure: LAPAROSCOPY SURGICAL WITH [place] clips; Surgeon: Almyra Brace, MD; Location: [facility]; Service: Gynecology; Laterality: Bilateral;   Past Family History: . Diabetes Mother  . Osteoporosis (Thinning of bones) Mother  . Heart disease Mother  . Clotting disorder Sister  . Diabetes Paternal Grandmother  . Breast cancer Paternal Grandmother  . Anesthesia problems Neg Hx   Medications: . acetaminophen (TYLENOL) 650 MG ER tablet Take 1,300 mg by mouth as needed for Pain  . clobetasoL (TEMOVATE) 0.05 % cream as needed  . DULoxetine (CYMBALTA) 20 MG DR capsule Take 1 capsule (20 mg total) by mouth once daily 30 capsule 11   . ibuprofen (MOTRIN) 200 MG tablet Take 800 mg by mouth as needed for Pain  . levothyroxine (SYNTHROID) 25 MCG tablet Take 1 tablet (25 mcg total) by mouth once daily 30 tablet 11   No current Epic-ordered facility-administered medications on file.   Allergies: . Iodinated Contrast Media Rash   Review of Systems:  A comprehensive 14 point ROS was performed, reviewed by me today, and the pertinent orthopaedic findings are documented in the HPI.  Physical Exam: BP 122/80  Ht 157.5 cm (5' 2.01")  Wt 74.5 kg (164 lb 3.2 oz)  BMI 30.02 kg/m  General/Constitutional: The patient appears to be well-nourished, well-developed, and in no acute distress. Neuro/Psych: Normal mood and affect, oriented to person, place and time. Eyes: Non-icteric. Pupils are equal, round, and reactive to light, and exhibit synchronous movement. ENT: Unremarkable. Lymphatic: No palpable adenopathy. Respiratory: Non-labored breathing Cardiovascular: No edema, swelling or tenderness, except as noted in detailed exam. Integumentary: No impressive skin lesions present, except as noted in detailed exam. Musculoskeletal: Unremarkable, except as noted in detailed exam.  Neck: Neck has full range of motion. There is no tenderness to palpation. Spurling's test is negative.   Bilateral Upper Extremity: Normal shoulder contour. Good active and passive range of motion and stability of the shoulder, elbow, and wrist. Normal motion of the hand and digits. No swelling, erythema, or ecchymosis is noted. There is no triggering or locking of the digits noted. No thenar atrophy is visualized. No intrinsic wasting. The patient  has a positive Phalen's test. The patient has a positive Tinel's test. The patient has decreased pinprick and light touch sensation in the median nerve distribution. There is normal grip strength and pincer strength. The patient has less than 2 second capillary refill with good skin warmth. Normal radial and  ulnar pulse is palpated.   Neurologic: Sensory function is intact, except as noted above. Motor strength is 5/5, except as noted above. No tremor or clonus is present. Good motor coordination is noted.   Imaging: None.  Impression: Carpal tunnel syndrome, left wrist.  Plan: The treatment options, including both surgical and nonsurgical choices, have been discussed in detail with the patient. She would like to proceed with surgical intervention to include an endoscopic left carpal tunnel release. The risks (including bleeding, infection, nerve and/or blood vessel injury, persistent or recurrent pain/paresthesias, weakness of grip, need for further surgery, blood clots, strokes, heart attacks or arrhythmias, pneumonia, etc.) and benefits of the surgical procedure were discussed. The patient states his/her understanding and agrees to proceed. A formal written consent will be obtained by the nursing staff.      H&P reviewed and patient re-examined. No changes.

## 2020-06-08 NOTE — Discharge Instructions (Addendum)
AMBULATORY SURGERY  DISCHARGE INSTRUCTIONS   1) The drugs that you were given will stay in your system until tomorrow so for the next 24 hours you should not:  A) Drive an automobile B) Make any legal decisions C) Drink any alcoholic beverage   2) You may resume regular meals tomorrow.  Today it is better to start with liquids and gradually work up to solid foods.  You may eat anything you prefer, but it is better to start with liquids, then soup and crackers, and gradually work up to solid foods.   3) Please notify your doctor immediately if you have any unusual bleeding, trouble breathing, redness and pain at the surgery site, drainage, fever, or pain not relieved by medication.    4) Additional Instructions:        Please contact your physician with any problems or Same Day Surgery at [phone], Monday through Friday 6 am to 4 pm, or [place] at [facility] number at [phone].Orthopedic discharge instructions: Keep dressing dry and intact. Keep hand elevated above heart level. May shower after dressing removed on postop day 4 (Sunday). Cover sutures with Band-Aids after drying off. Apply ice to affected area frequently. Take ibuprofen 600-800 mg TID with meals for 7-10 days, then as necessary. Take ES Tylenol or pain medication as prescribed when needed.  Return for follow-up in 10-14 days or as scheduled.

## 2020-06-08 NOTE — Anesthesia Postprocedure Evaluation (Signed)
Anesthesia Post Note  [name]  Procedure(s) Performed: CARPAL TUNNEL RELEASE ENDOSCOPIC (Left Hand)  Patient location during evaluation: PACU Anesthesia Type: General Level of consciousness: awake and alert Pain management: pain level controlled Vital Signs Assessment: post-procedure vital signs reviewed and stable Respiratory status: spontaneous breathing, nonlabored ventilation, respiratory function stable and patient connected to nasal cannula oxygen Cardiovascular status: blood pressure returned to baseline and stable Postop Assessment: no apparent nausea or vomiting Anesthetic complications: no   No complications documented.   Last Vitals:  Vitals:   06/08/20 0902 06/08/20 0916  BP: 120/88 130/85  Pulse: 91 93  Resp: 20 20  Temp: (!) 36.2 C (!) 36.1 C  SpO2: 98% 97%    Last Pain:  Vitals:   06/08/20 0916  TempSrc: Temporal  PainSc: 0-No pain                 Arita Miss

## 2020-06-10 ENCOUNTER — Telehealth: Payer: Self-pay

## 2020-06-10 NOTE — Telephone Encounter (Signed)
PC to patient.  Left vm to return call to [phone] Arbie Cookey, RN, re: matter related to recent procedure at [facility].  Pt. will need to be informed of [place] water issue, in relation to being a patient at [facility].  If pt. Returns call, please transfer to Triage RN.

## 2020-07-27 ENCOUNTER — Other Ambulatory Visit: Payer: Medicaid Other

## 2020-07-27 ENCOUNTER — Other Ambulatory Visit: Payer: Self-pay

## 2020-07-27 DIAGNOSIS — Z20822 Contact with and (suspected) exposure to covid-19: Secondary | ICD-10-CM

## 2020-07-29 LAB — NOVEL CORONAVIRUS, NAA: SARS-CoV-2, NAA: NOT DETECTED

## 2020-10-07 ENCOUNTER — Other Ambulatory Visit: Payer: Self-pay

## 2020-10-07 ENCOUNTER — Ambulatory Visit
Admission: EM | Admit: 2020-10-07 | Discharge: 2020-10-07 | Disposition: A | Discharge status: Home / Self Care | Payer: BC Managed Care – PPO | Attending: Family Medicine | Admitting: Family Medicine

## 2020-10-07 DIAGNOSIS — Z1152 Encounter for screening for COVID-19: Secondary | ICD-10-CM

## 2020-10-07 DIAGNOSIS — H66001 Acute suppurative otitis media without spontaneous rupture of ear drum, right ear: Secondary | ICD-10-CM | POA: Diagnosis not present

## 2020-10-07 DIAGNOSIS — J029 Acute pharyngitis, unspecified: Secondary | ICD-10-CM

## 2020-10-07 DIAGNOSIS — R059 Cough, unspecified: Secondary | ICD-10-CM | POA: Diagnosis not present

## 2020-10-07 DIAGNOSIS — R519 Headache, unspecified: Secondary | ICD-10-CM | POA: Diagnosis not present

## 2020-10-07 DIAGNOSIS — R0981 Nasal congestion: Secondary | ICD-10-CM

## 2020-10-07 LAB — POCT RAPID STREP A (OFFICE): Rapid Strep A Screen: NEGATIVE

## 2020-10-07 MED ORDER — AMOXICILLIN-POT CLAVULANATE 875-125 MG PO TABS: 1.0000 | ORAL_TABLET | Freq: Two times a day (BID) | ORAL | 0 refills | Status: AC

## 2020-10-07 NOTE — ED Triage Notes (Signed)
Pt reports having sore throat, nasal congestion and productive cough that began yesterday. Painful to swallow, headache. No known covid exposure.

## 2020-10-07 NOTE — ED Provider Notes (Addendum)
[place]   [id number] 10/07/20 Arrival Time: 5916  CC: EAR PAIN  SUBJECTIVE: History from: patient.  Valerie Pearson is a 39 y.o. female who presents with sore throat, nasal congestion, right ear pain and productive cough that began yesterday. Denies a precipitating event, such as swimming or wearing ear plugs. Patient states the pain is constant and achy in character. Patient has not taken OTC medications for this. Symptoms are made worse with lying down.Denies similar symptoms in the past. Denies fever, chills, fatigue, sinus pain, rhinorrhea, ear discharge, SOB, wheezing, chest pain, nausea, changes in bowel or bladder habits.    ROS: As per HPI.  All other pertinent ROS negative.     Past Medical History:  Diagnosis Date  . Anxiety   . Depression   . Hypothyroidism   . Migraines   . Uterine fibroid    Past Surgical History:  Procedure Laterality Date  . CARPAL TUNNEL RELEASE Left 06/08/2020   Procedure: CARPAL TUNNEL RELEASE ENDOSCOPIC;  Surgeon: Corky Mull, MD;  Location: [facility] ORS;  Service: Orthopedics;  Laterality: Left;  . CHOLECYSTECTOMY  2006  . COLONOSCOPY  2013, 2018  . DILATION AND CURETTAGE OF UTERUS  2014  . ESOPHAGOGASTRODUODENOSCOPY  2018  . TUBAL LIGATION  2016   Allergies  Allergen Reactions  . Iodinated Diagnostic Agents Rash   No current facility-administered medications on file prior to encounter.   Current Outpatient Medications on File Prior to Encounter  Medication Sig Dispense Refill  . acetaminophen (TYLENOL) 500 MG tablet Take 1,000 mg by mouth every 6 (six) hours as needed for moderate pain.    . Biotin w/ Vitamins C & E (HAIR/SKIN/NAILS PO) Take 1 tablet by mouth at bedtime.     . clobetasol cream (TEMOVATE) 3.84 % Apply 1 application topically 2 (two) times daily as needed (psoriasis).     . DULoxetine (CYMBALTA) 20 MG capsule Take 20 mg by mouth at bedtime.     Marland Kitchen HYDROcodone-acetaminophen (NORCO) 5-325 MG tablet Take 1-2  tablets by mouth every 6 (six) hours as needed for moderate pain or severe pain. MAXIMUM TOTAL ACETAMINOPHEN DOSE IS 4000 MG PER DAY 10 tablet 0  . levothyroxine (SYNTHROID) 25 MCG tablet Take 25 mcg by mouth at bedtime.      Social History   Socioeconomic History  . Marital status: Married    Spouse name: Not on file  . Number of children: 3  . Years of education: Not on file  . Highest education level: Not on file  Occupational History  . Not on file  Tobacco Use  . Smoking status: Never Smoker  . Smokeless tobacco: Never Used  Vaping Use  . Vaping Use: Never used  Substance and Sexual Activity  . Alcohol use: Not Currently  . Drug use: Never  . Sexual activity: Not on file  Other Topics Concern  . Not on file  Social History Narrative  . Not on file   Social Determinants of Health   Financial Resource Strain:   . Difficulty of Paying Living Expenses: Not on file  Food Insecurity:   . Worried About Charity fundraiser in the Last Year: Not on file  . Ran Out of Food in the Last Year: Not on file  Transportation Needs:   . Lack of Transportation (Medical): Not on file  . Lack of Transportation (Non-Medical): Not on file  Physical Activity:   . Days of Exercise per Week: Not on file  . Minutes  of Exercise per Session: Not on file  Stress:   . Feeling of Stress : Not on file  Social Connections:   . Frequency of Communication with Friends and Family: Not on file  . Frequency of Social Gatherings with Friends and Family: Not on file  . Attends Religious Services: Not on file  . Active Member of Clubs or Organizations: Not on file  . Attends Archivist Meetings: Not on file  . Marital Status: Not on file  Intimate Partner Violence:   . Fear of Current or Ex-Partner: Not on file  . Emotionally Abused: Not on file  . Physically Abused: Not on file  . Sexually Abused: Not on file   No family history on file.  OBJECTIVE:  Vitals:   10/07/20 1351 10/07/20  1352  BP: 115/79   Pulse: 91   Resp: (!) 97   Temp: 98.7 F (37.1 C)   TempSrc: Oral   SpO2: 97%   Weight:  160 lb (72.6 kg)  Height:  5\' 1"  (1.549 m)     General appearance: alert; appears fatigued HEENT: Ears: EACs clear, L TM pearly gray with visible cone of light, without erythema, R TM erythematous, bulging, with effusion; Eyes: PERRL, EOMI grossly; Sinuses nontender to palpation; Nose: clear rhinorrhea; Throat: oropharynx mildly erythematous, cobblestoning present, tonsils 1+ without white tonsillar exudates, uvula midline Neck: supple with LAD Lungs: unlabored respirations, symmetrical air entry; cough: absent; no respiratory distress Heart: regular rate and rhythm.  Radial pulses 2+ symmetrical bilaterally Skin: warm and dry Psychological: alert and cooperative; normal mood and affect  Imaging: No results found.   ASSESSMENT & PLAN:  1. Non-recurrent acute suppurative otitis media of right ear without spontaneous rupture of tympanic membrane   2. Encounter for screening for COVID-19   3. Cough   4. Nonintractable headache, unspecified chronicity pattern, unspecified headache type   5. Nasal congestion   6. Sore throat     Meds ordered this encounter  Medications  . amoxicillin-clavulanate (AUGMENTIN) 875-125 MG tablet    Sig: Take 1 tablet by mouth 2 (two) times daily for 7 days.    Dispense:  14 tablet    Refill:  0    Order Specific Question:   Supervising Provider    Answer:   Chase Picket [id number]   Rapid strep is negative Rest and drink plenty of fluids Prescribed augmentin 875 for 7 days Will treat AOM due to pain severity and clinical presentation Covid/Flu/RSV swab obtained in office today.  Patient instructed to quarantine until results are back and negative.  If results are negative, patient may resume daily schedule as tolerated once they are fever free for 24 hours without the use of antipyretic medications.  If results are positive, patient  instructed to quarantine 10 days from today.  Patient instructed to follow-up with primary care with this office as needed.  Patient instructed to follow-up in the ER for trouble swallowing, trouble breathing, other concerning symptoms.  Reviewed expectations re: course of current medical issues. Questions answered. Outlined signs and symptoms indicating need for more acute intervention. Patient verbalized understanding. After Visit Summary given.         Faustino Congress, NP 10/07/20 1602    Faustino Congress, NP 10/07/20 [phone]

## 2020-10-07 NOTE — Discharge Instructions (Signed)
You have a right ear infection  I have sent in Augmentin for you to take twice a day for 7 days.  Follow up with this office or with primary care if symptoms are persisting.  Follow up in the ER for high fever, trouble swallowing, trouble breathing, other concerning symptoms.

## 2020-10-09 LAB — COVID-19, FLU A+B AND RSV
Influenza A, NAA: NOT DETECTED
Influenza B, NAA: NOT DETECTED
RSV, NAA: NOT DETECTED
SARS-CoV-2, NAA: NOT DETECTED

## 2020-11-01 ENCOUNTER — Encounter: Payer: Self-pay | Admitting: Emergency Medicine

## 2020-11-01 ENCOUNTER — Other Ambulatory Visit: Payer: Self-pay

## 2020-11-01 ENCOUNTER — Ambulatory Visit
Admission: EM | Admit: 2020-11-01 | Discharge: 2020-11-01 | Disposition: A | Discharge status: Home / Self Care | Payer: BC Managed Care – PPO | Attending: Family Medicine | Admitting: Family Medicine

## 2020-11-01 DIAGNOSIS — Z1152 Encounter for screening for COVID-19: Secondary | ICD-10-CM

## 2020-11-01 DIAGNOSIS — J0100 Acute maxillary sinusitis, unspecified: Secondary | ICD-10-CM

## 2020-11-01 MED ORDER — CETIRIZINE HCL 10 MG PO TABS
10.0000 mg | ORAL_TABLET | Freq: Every day | ORAL | 0 refills | Status: DC
Start: 2020-11-01 — End: 2022-08-09

## 2020-11-01 MED ORDER — FLUTICASONE PROPIONATE 50 MCG/ACT NA SUSP
2.0000 | Freq: Every day | NASAL | 2 refills | Status: DC
Start: 2020-11-01 — End: 2022-08-09

## 2020-11-01 NOTE — ED Provider Notes (Signed)
[facility]    CSN: [id number] Arrival date & time: 11/01/20  1017      History   Chief Complaint Chief Complaint  Patient presents with  . Nasal Congestion    HPI Valerie Pearson is a 39 y.o. female.   Patient is a 39 year old female who presents today with nasal congestion, headache, scratchy throat, ear discomfort x2 days.  Reporting "feeling warm but denies taking temperature.  No fever here today.  Child is also been sick.  Taking Advil sinus with some relief.     Past Medical History:  Diagnosis Date  . Anxiety   . Depression   . Hypothyroidism   . Migraines   . Uterine fibroid     There are no problems to display for this patient.   Past Surgical History:  Procedure Laterality Date  . CARPAL TUNNEL RELEASE Left 06/08/2020   Procedure: CARPAL TUNNEL RELEASE ENDOSCOPIC;  Surgeon: Corky Mull, MD;  Location: [facility] ORS;  Service: Orthopedics;  Laterality: Left;  . CHOLECYSTECTOMY  2006  . COLONOSCOPY  2013, 2018  . DILATION AND CURETTAGE OF UTERUS  2014  . ESOPHAGOGASTRODUODENOSCOPY  2018  . TUBAL LIGATION  2016    OB History   No obstetric history on file.      Home Medications    Prior to Admission medications   Medication Sig Start Date End Date Taking? Authorizing Provider  DULoxetine (CYMBALTA) 20 MG capsule Take 20 mg by mouth at bedtime.  04/29/20  Yes [provider]  levothyroxine (SYNTHROID) 25 MCG tablet Take 25 mcg by mouth at bedtime.  03/27/20  Yes [provider]  acetaminophen (TYLENOL) 500 MG tablet Take 1,000 mg by mouth every 6 (six) hours as needed for moderate pain.    [provider]  Biotin w/ Vitamins C & E (HAIR/SKIN/NAILS PO) Take 1 tablet by mouth at bedtime.     [provider]  cetirizine (ZYRTEC) 10 MG tablet Take 1 tablet (10 mg total) by mouth daily. 11/01/20   Loura Halt A, NP  clobetasol cream (TEMOVATE) 5.28 % Apply 1 application topically 2 (two) times daily as needed  (psoriasis).  03/10/20   [provider]  fluticasone (FLONASE) 50 MCG/ACT nasal spray Place 2 sprays into both nostrils daily. 11/01/20   Loura Halt A, NP  HYDROcodone-acetaminophen (NORCO) 5-325 MG tablet Take 1-2 tablets by mouth every 6 (six) hours as needed for moderate pain or severe pain. MAXIMUM TOTAL ACETAMINOPHEN DOSE IS 4000 MG PER DAY 06/08/20   Poggi, Marshall Cork, MD    Family History History reviewed. No pertinent family history.  Social History Social History   Tobacco Use  . Smoking status: Never Smoker  . Smokeless tobacco: Never Used  Vaping Use  . Vaping Use: Never used  Substance Use Topics  . Alcohol use: Not Currently  . Drug use: Never     Allergies   Iodinated diagnostic agents   Review of Systems Review of Systems   Physical Exam Triage Vital Signs ED Triage Vitals  Enc Vitals Group     BP 11/01/20 1027 121/78     Pulse Rate 11/01/20 1027 (!) 103     Resp 11/01/20 1027 15     Temp 11/01/20 1027 98.2 F (36.8 C)     Temp Source 11/01/20 1027 Oral     SpO2 11/01/20 1027 98 %     Weight --      Height --      Head  Circumference --      Peak Flow --      Pain Score 11/01/20 1028 0     Pain Loc --      Pain Edu? --      Excl. in [place]? --    No data found.  Updated Vital Signs BP 121/78 (BP Location: Left Arm)   Pulse (!) 103   Temp 98.2 F (36.8 C) (Oral)   Resp 15   LMP 10/02/2020   SpO2 98%   Visual Acuity Right Eye Distance:   Left Eye Distance:   Bilateral Distance:    Right Eye Near:   Left Eye Near:    Bilateral Near:     Physical Exam Vitals and nursing note reviewed.  Constitutional:      General: She is not in acute distress.    Appearance: Normal appearance. She is not ill-appearing, toxic-appearing or diaphoretic.  HENT:     Head: Normocephalic.     Right Ear: External ear normal.     Left Ear: External ear normal.     Ears:     Comments: Bilateral TM injection with left effusion    Nose: Congestion  present.     Mouth/Throat:     Pharynx: Oropharynx is clear.  Eyes:     Conjunctiva/sclera: Conjunctivae normal.  Cardiovascular:     Rate and Rhythm: Normal rate and regular rhythm.  Pulmonary:     Effort: Pulmonary effort is normal.     Breath sounds: Normal breath sounds.  Musculoskeletal:        General: Normal range of motion.     Cervical back: Normal range of motion.  Skin:    General: Skin is warm and dry.     Findings: No rash.  Neurological:     Mental Status: She is alert.  Psychiatric:        Mood and Affect: Mood normal.      UC Treatments / Results  Labs (all labs ordered are listed, but only abnormal results are displayed) Labs Reviewed  NOVEL CORONAVIRUS, NAA    EKG   Radiology No results found.  Procedures Procedures (including critical care time)  Medications Ordered in UC Medications - No data to display  Initial Impression / Assessment and Plan / UC Course  I have reviewed the triage vital signs and the nursing notes.  Pertinent labs & imaging results that were available during my care of the patient were reviewed by me and considered in my medical decision making (see chart for details).     Sinusitis Treating with Flonase and Zyrtec for symptoms.  She can continue the over-the-counter cold and sinus medicine as needed. Covid swab pending Follow up as needed for continued or worsening symptoms  Final Clinical Impressions(s) / UC Diagnoses   Final diagnoses:  Acute non-recurrent maxillary sinusitis  Encounter for screening for COVID-19     Discharge Instructions     Flonase and Zyrtec for symptoms Over-the-counter medicines as needed Covid swab pending Follow up as needed for continued or worsening symptoms      ED Prescriptions    Medication Sig Dispense Auth. Provider   fluticasone (FLONASE) 50 MCG/ACT nasal spray Place 2 sprays into both nostrils daily. 16 g Ayodele Hartsock A, NP   cetirizine (ZYRTEC) 10 MG tablet Take 1  tablet (10 mg total) by mouth daily. 30 tablet Loura Halt A, NP     PDMP not reviewed this encounter.   Orvan July, NP 11/01/20 1047

## 2020-11-01 NOTE — Discharge Instructions (Addendum)
Flonase and Zyrtec for symptoms Over-the-counter medicines as needed Covid swab pending Follow up as needed for continued or worsening symptoms

## 2020-11-01 NOTE — ED Triage Notes (Signed)
Patient c/o nasal congestion, headache, and "scratchy throat" x 3 days.   Patient endorses "feeling warm" but denies taking a temperature.   Patient stated "my child has been sick and was prescribed antibiotics by the doctor yesterday".   Patient has taken Advil w/ some relief of headache.

## 2020-11-03 LAB — SARS-COV-2, NAA 2 DAY TAT

## 2020-11-03 LAB — NOVEL CORONAVIRUS, NAA: SARS-CoV-2, NAA: NOT DETECTED

## 2020-12-05 ENCOUNTER — Other Ambulatory Visit: Payer: BC Managed Care – PPO

## 2020-12-09 ENCOUNTER — Ambulatory Visit
Admission: EM | Admit: 2020-12-09 | Discharge: 2020-12-09 | Disposition: A | Discharge status: Home / Self Care | Payer: BC Managed Care – PPO | Attending: Family Medicine | Admitting: Family Medicine

## 2020-12-09 ENCOUNTER — Encounter: Payer: Self-pay | Admitting: Family Medicine

## 2020-12-09 DIAGNOSIS — B349 Viral infection, unspecified: Secondary | ICD-10-CM

## 2020-12-09 NOTE — ED Provider Notes (Signed)
Roderic Palau    CSN: [id number] Arrival date & time: 12/09/20  1308      History   Chief Complaint Chief Complaint  Patient presents with  . Headache  . Generalized Body Aches  . Sore Throat    HPI Akina Maish is a 40 y.o. female.   Patient is a 40 year old female who presents today with complaints of sore throat, headache, body aches for the past couple days.  Symptoms have been constant.  Taken Tylenol for symptoms.  Possible COVID exposure     Past Medical History:  Diagnosis Date  . Anxiety   . Depression   . Hypothyroidism   . Migraines   . Uterine fibroid     There are no problems to display for this patient.   Past Surgical History:  Procedure Laterality Date  . CARPAL TUNNEL RELEASE Left 06/08/2020   Procedure: CARPAL TUNNEL RELEASE ENDOSCOPIC;  Surgeon: Corky Mull, MD;  Location: [facility] ORS;  Service: Orthopedics;  Laterality: Left;  . CHOLECYSTECTOMY  2006  . COLONOSCOPY  2013, 2018  . DILATION AND CURETTAGE OF UTERUS  2014  . ESOPHAGOGASTRODUODENOSCOPY  2018  . TUBAL LIGATION  2016    OB History   No obstetric history on file.      Home Medications    Prior to Admission medications   Medication Sig Start Date End Date Taking? Authorizing Provider  acetaminophen (TYLENOL) 500 MG tablet Take 1,000 mg by mouth every 6 (six) hours as needed for moderate pain.    [provider]  Biotin w/ Vitamins C & E (HAIR/SKIN/NAILS PO) Take 1 tablet by mouth at bedtime.     [provider]  cetirizine (ZYRTEC) 10 MG tablet Take 1 tablet (10 mg total) by mouth daily. 11/01/20   Loura Halt A, NP  clobetasol cream (TEMOVATE) 6.07 % Apply 1 application topically 2 (two) times daily as needed (psoriasis).  03/10/20   [provider]  DULoxetine (CYMBALTA) 20 MG capsule Take 20 mg by mouth at bedtime.  04/29/20   [provider]  fluticasone (FLONASE) 50 MCG/ACT nasal spray Place 2 sprays into both nostrils daily.  11/01/20   Loura Halt A, NP  HYDROcodone-acetaminophen (NORCO) 5-325 MG tablet Take 1-2 tablets by mouth every 6 (six) hours as needed for moderate pain or severe pain. MAXIMUM TOTAL ACETAMINOPHEN DOSE IS 4000 MG PER DAY 06/08/20   Poggi, Marshall Cork, MD  levothyroxine (SYNTHROID) 25 MCG tablet Take 25 mcg by mouth at bedtime.  03/27/20   [provider]    Family History Family History  Family history unknown: Yes    Social History Social History   Tobacco Use  . Smoking status: Never Smoker  . Smokeless tobacco: Never Used  Vaping Use  . Vaping Use: Never used  Substance Use Topics  . Alcohol use: Not Currently  . Drug use: Never     Allergies   Iodinated diagnostic agents   Review of Systems Review of Systems   Physical Exam Triage Vital Signs ED Triage Vitals  Enc Vitals Group     BP 12/09/20 1418 116/68     Pulse Rate 12/09/20 1418 97     Resp 12/09/20 1418 17     Temp 12/09/20 1418 98.7 F (37.1 C)     Temp Source 12/09/20 1418 Oral     SpO2 12/09/20 1418 98 %     Weight --      Height --  Head Circumference --      Peak Flow --      Pain Score 12/09/20 1409 7     Pain Loc --      Pain Edu? --      Excl. in [place]? --    No data found.  Updated Vital Signs BP 116/68 (BP Location: Left Arm)   Pulse 97   Temp 98.7 F (37.1 C) (Oral)   Resp 17   LMP  (LMP Unknown)   SpO2 98%   Visual Acuity Right Eye Distance:   Left Eye Distance:   Bilateral Distance:    Right Eye Near:   Left Eye Near:    Bilateral Near:     Physical Exam Vitals and nursing note reviewed.  Constitutional:      General: She is not in acute distress.    Appearance: Normal appearance. She is not ill-appearing, toxic-appearing or diaphoretic.  HENT:     Head: Normocephalic.     Right Ear: Tympanic membrane and ear canal normal.     Ears:     Comments: Left middle ear effusion     Nose: Nose normal.     Mouth/Throat:     Pharynx: Oropharynx is clear.  Eyes:      Conjunctiva/sclera: Conjunctivae normal.  Cardiovascular:     Rate and Rhythm: Normal rate and regular rhythm.  Pulmonary:     Effort: Pulmonary effort is normal.     Breath sounds: Normal breath sounds.  Musculoskeletal:        General: Normal range of motion.     Cervical back: Normal range of motion.  Skin:    General: Skin is warm and dry.     Findings: No rash.  Neurological:     Mental Status: She is alert.  Psychiatric:        Mood and Affect: Mood normal.      UC Treatments / Results  Labs (all labs ordered are listed, but only abnormal results are displayed) Labs Reviewed  COVID-19, FLU A+B NAA    EKG   Radiology No results found.  Procedures Procedures (including critical care time)  Medications Ordered in UC Medications - No data to display  Initial Impression / Assessment and Plan / UC Course  I have reviewed the triage vital signs and the nursing notes.  Pertinent labs & imaging results that were available during my care of the patient were reviewed by me and considered in my medical decision making (see chart for details).     Viral illness Nothing concerning on exam.  Recommended Flonase and Zyrtec for symptoms.  Ibuprofen for pain as needed.  COVID swab pending. Follow up as needed for continued or worsening symptoms  Final Clinical Impressions(s) / UC Diagnoses   Final diagnoses:  Viral illness     Discharge Instructions     Recommend Flonase, Zyrtec and ibuprofen for symptoms Rest, hydrate.  Your COVID swab is pending.  We will call you with any positive results.  You can also check your MyChart for results.   ED Prescriptions    None     PDMP not reviewed this encounter.   Orvan July, NP 12/09/20 1432

## 2020-12-09 NOTE — ED Triage Notes (Signed)
Pt presents with c/o sore throat , headache and body aches for past couple of days, possible covid exposure

## 2020-12-09 NOTE — Discharge Instructions (Addendum)
Recommend Flonase, Zyrtec and ibuprofen for symptoms Rest, hydrate.  Your COVID swab is pending.  We will call you with any positive results.  You can also check your MyChart for results.

## 2020-12-13 LAB — COVID-19, FLU A+B NAA
Influenza A, NAA: NOT DETECTED
Influenza B, NAA: NOT DETECTED
SARS-CoV-2, NAA: NOT DETECTED

## 2021-11-25 IMAGING — US US EXTREM LOW*L* LIMITED
1 series · 14 of 14 positions shown · non-contrast
Comparison: None.

CLINICAL DATA: Pain in the left popliteal fossa for 4 months.
Evaluate for possible Baker's cyst.

EXAM:
ULTRASOUND LEFT LOWER EXTREMITY LIMITED
TECHNIQUE: Ultrasound examination of the lower extremity soft tissues was
performed in the area of clinical concern (left popliteal fossa).

[Series 1: us extrem low*left* limited · 0.11mm/px · 14 of 14 slices shown]
[im 1/14]
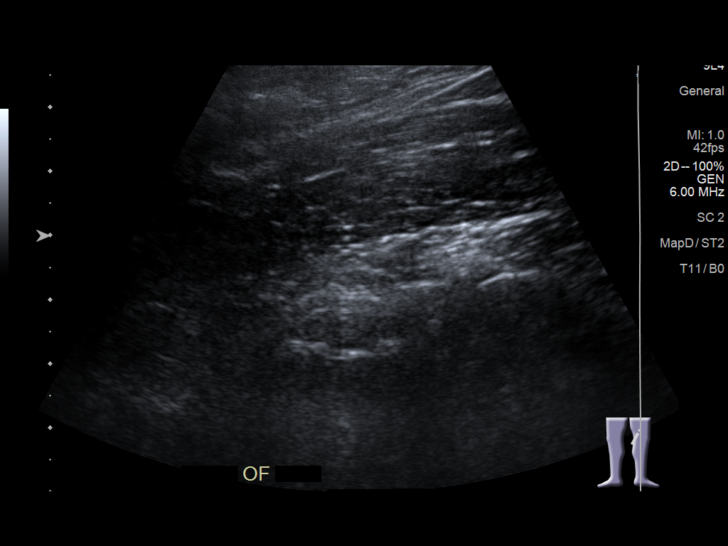
[im 2/14]
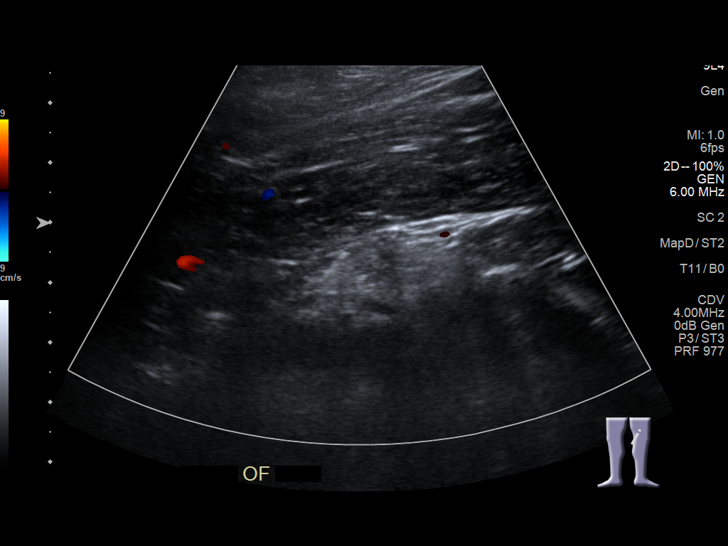
[im 3/14]
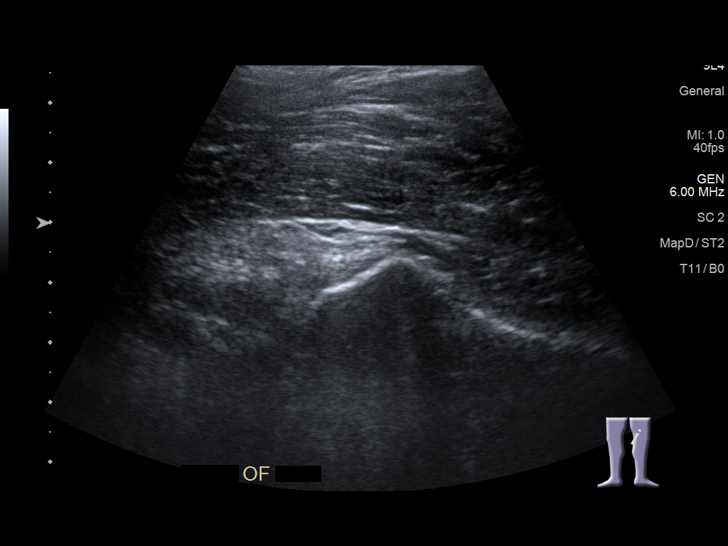
[im 4/14]
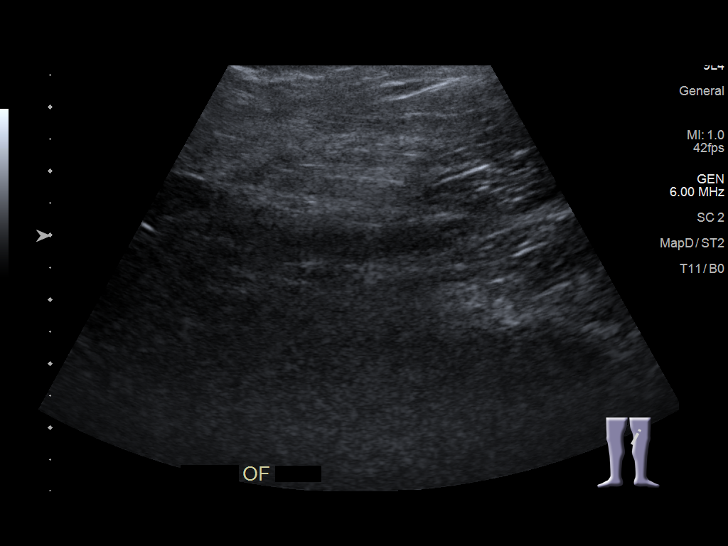
[im 5/14]
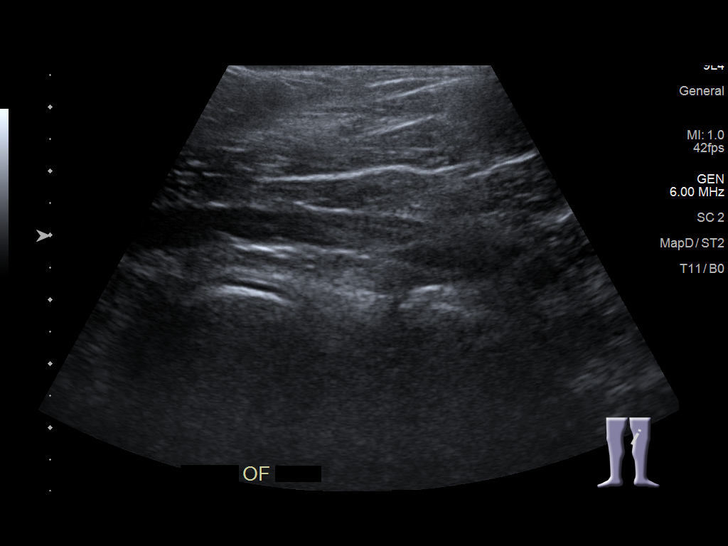
[im 6/14]
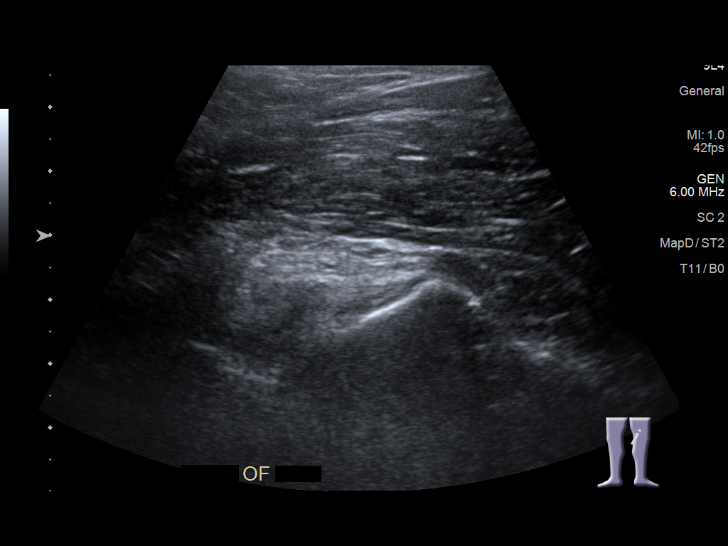
[im 7/14]
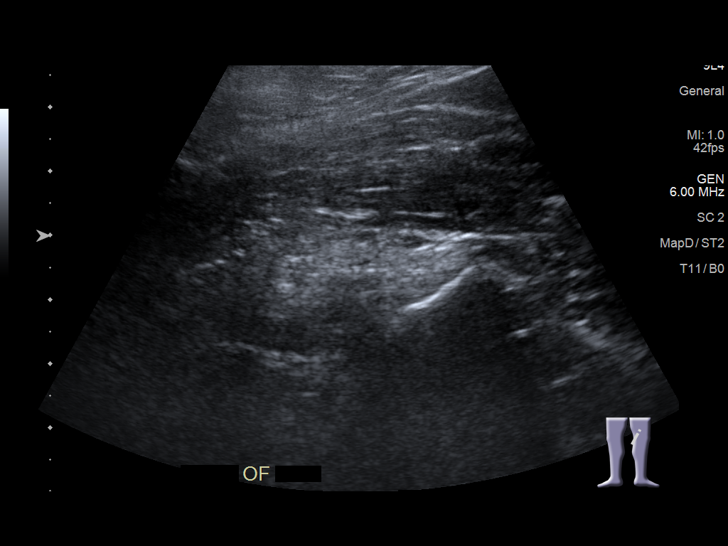
[im 8/14]
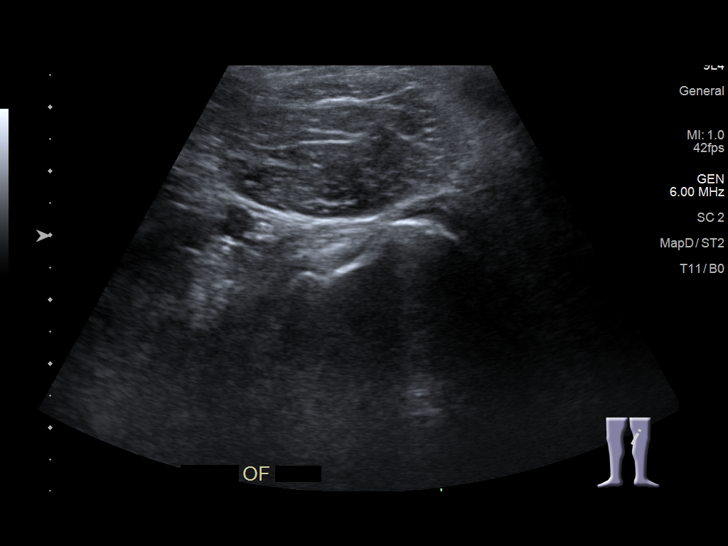
[im 9/14]
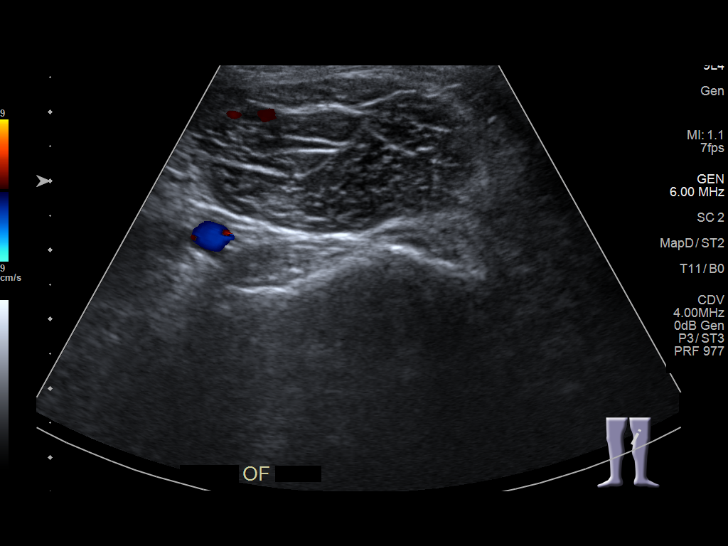
[im 10/14]
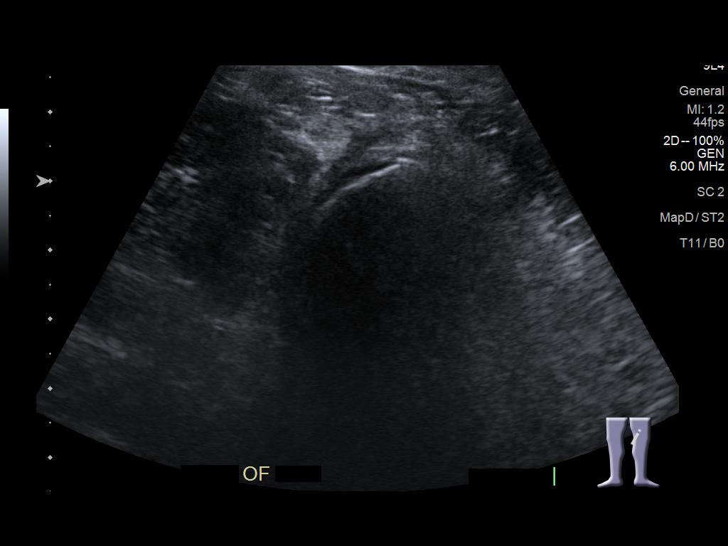
[im 11/14]
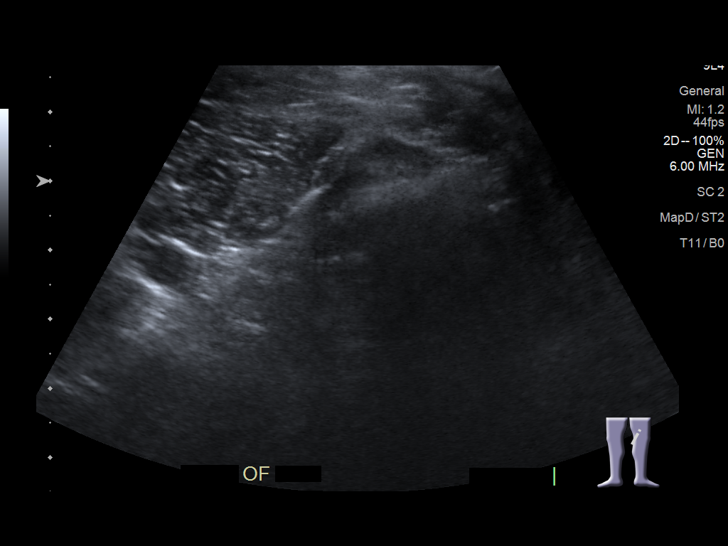
[im 12/14]
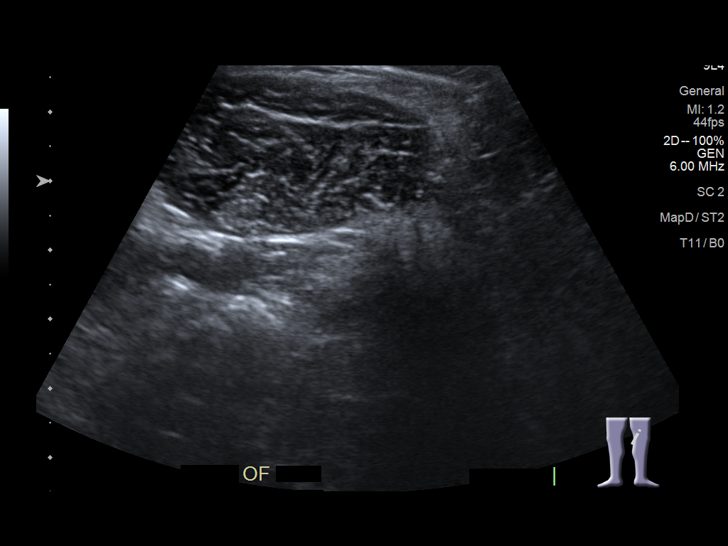
[im 13/14]
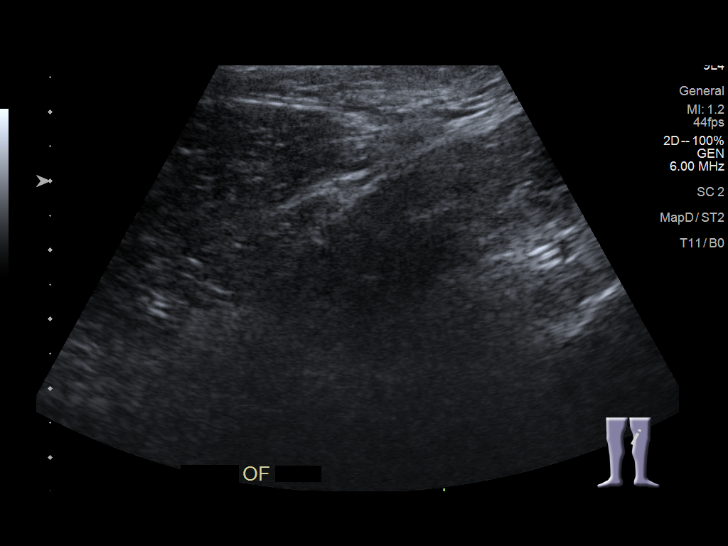
[im 14/14]
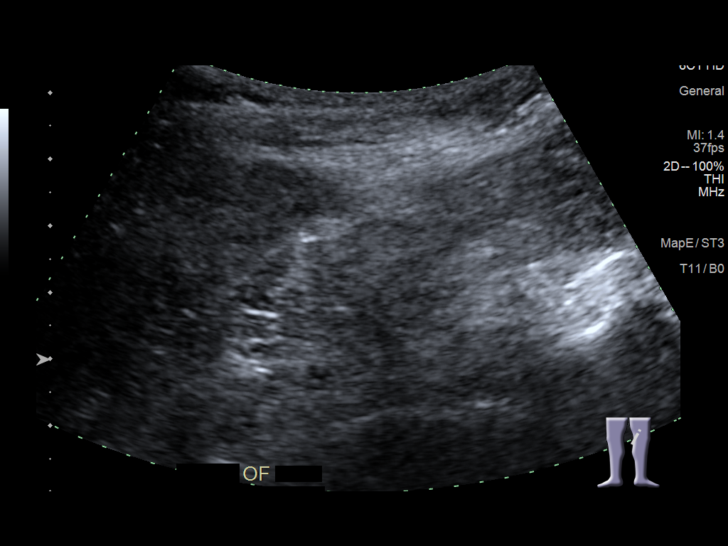

[14 of 14 positions shown; findings below may reference images not displayed]

FINDINGS: No mass, fluid collection or abnormal vascularity identified within
the left popliteal fossa.
IMPRESSION: Normal examination.  No evidence of Baker's cyst.

## 2021-12-17 ENCOUNTER — Ambulatory Visit
Admission: EM | Admit: 2021-12-17 | Discharge: 2021-12-17 | Disposition: A | Discharge status: Home / Self Care | Payer: 59 | Attending: Family Medicine | Admitting: Family Medicine

## 2021-12-17 ENCOUNTER — Other Ambulatory Visit: Payer: Self-pay

## 2021-12-17 DIAGNOSIS — J111 Influenza due to unidentified influenza virus with other respiratory manifestations: Secondary | ICD-10-CM | POA: Diagnosis not present

## 2021-12-17 LAB — POCT INFLUENZA A/B
Influenza A, POC: NEGATIVE
Influenza B, POC: NEGATIVE

## 2021-12-17 MED ORDER — PROMETHAZINE-DM 6.25-15 MG/5ML PO SYRP: 5.0000 mL | ORAL_SOLUTION | Freq: Four times a day (QID) | ORAL | 0 refills | Status: DC | PRN

## 2021-12-17 MED ORDER — OSELTAMIVIR PHOSPHATE 75 MG PO CAPS: 75.0000 mg | ORAL_CAPSULE | Freq: Two times a day (BID) | ORAL | 0 refills | Status: DC

## 2021-12-17 NOTE — ED Provider Notes (Signed)
[facility]    CSN: [id number] Arrival date & time: 12/17/21  0932      History   Chief Complaint Chief Complaint  Patient presents with   Cough   Fever   Generalized Body Aches    HPI Valerie Pearson is a 41 y.o. female.   HPI Patient presents for evaluation of flulike symptoms including generalized body aches, headache, fever and cough which developed x1 day ago.  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  She reports feeling achy upon awakening yesterday morning and the symptoms have progressively worsened over the last 24 hours.  She denies any overt shortness of breath although endorses some chest tightness with coughing and deep breathing.  She has taken Tylenol for management of her fever.  She denies any underlying history of asthma or chronic bronchitis.  Past Medical History:  Diagnosis Date   Anxiety    Depression    Hypothyroidism    Migraines    Uterine fibroid     There are no problems to display for this patient.   Past Surgical History:  Procedure Laterality Date   CARPAL TUNNEL RELEASE Left 06/08/2020   Procedure: CARPAL TUNNEL RELEASE ENDOSCOPIC;  Surgeon: Corky Mull, MD;  Location: [facility] OR[facility];  Service: Orthopedics;  Laterality: Left;   CHOLECYSTECTOMY  2006   COLONOSCOPY  2013, 2018   DILATION AND CURETTAGE OF UTERUS  2014   ESOPHAGOGASTRODUODENOSCOPY  2018   TUBAL LIGATION  2016    OB History   No obstetric history on file.      Home Medications    Prior to Admission medications   Medication Sig Start Date End Date Taking? Authorizing Provider  oseltamivir (TAMIFLU) 75 MG capsule Take 1 capsule (75 mg total) by mouth 2 (two) times daily. 12/17/21  Yes Scot Jun, FNP  promethazine-dextromethorphan (PROMETHAZINE-DM) 6.25-15 MG/5ML syrup Take 5 mLs by mouth 4 (four) times daily as needed for cough. 12/17/21  Yes Scot Jun, FNP  acetaminophen  (TYLENOL) 500 MG tablet Take 1,000 mg by mouth every 6 (six) hours as needed for moderate pain.    [provider]  Biotin w/ Vitamins C & E (HAIR/SKIN/NAILS PO) Take 1 tablet by mouth at bedtime.     [provider]  cetirizine (ZYRTEC) 10 MG tablet Take 1 tablet (10 mg total) by mouth daily. 11/01/20   Loura Halt A, NP  clobetasol cream (TEMOVATE) 6.50 % Apply 1 application topically 2 (two) times daily as needed (psoriasis).  03/10/20   [provider]  DULoxetine (CYMBALTA) 20 MG capsule Take 20 mg by mouth at bedtime.  04/29/20   [provider]  fluticasone (FLONASE) 50 MCG/ACT nasal spray Place 2 sprays into both nostrils daily. 11/01/20   Loura Halt A, NP  HYDROcodone-acetaminophen (NORCO) 5-325 MG tablet Take 1-2 tablets by mouth every 6 (six) hours as needed for moderate pain or severe pain. MAXIMUM TOTAL ACETAMINOPHEN DOSE IS 4000 MG PER DAY 06/08/20   Poggi, Marshall Cork, MD  levothyroxine (SYNTHROID) 25 MCG tablet Take 25 mcg by mouth at bedtime.  03/27/20   [provider]    Family History Family History  Family history unknown: Yes    Social History Social History   Tobacco Use   Smoking status: Never   Smokeless tobacco: Never  Vaping Use   Vaping Use: Never used  Substance Use Topics   Alcohol  use: Not Currently   Drug use: Never     Allergies   Iodinated contrast media   Review of Systems Review of Systems Pertinent negatives listed in HPI  Physical Exam Triage Vital Signs ED Triage Vitals  Enc Vitals Group     BP 12/17/21 0951 126/79     Pulse Rate 12/17/21 0951 92     Resp 12/17/21 0951 16     Temp 12/17/21 0951 98.2 F (36.8 C)     Temp Source 12/17/21 0951 Temporal     SpO2 12/17/21 0951 98 %     Weight --      Height --      Head Circumference --      Peak Flow --      Pain Score 12/17/21 0952 10     Pain Loc --      Pain Edu? --      Excl. in [place]? --    No data found.  Updated Vital Signs BP 126/79  (BP Location: Left Arm)    Pulse 92    Temp 98.2 F (36.8 C) (Temporal)    Resp 16    SpO2 98%   Visual Acuity Right Eye Distance:   Left Eye Distance:   Bilateral Distance:    Right Eye Near:   Left Eye Near:    Bilateral Near:     Physical Exam  General Appearance:    Alert, acutely ill-appearing, no distress  HENT:   Normocephalic, ears normal, nares mucosal edema with congestion, rhinorrhea, oropharynx w/o exudate or erythema  Eyes:    PERRL, conjunctiva/corneas clear, EOM's intact       Lungs:     Clear to auscultation bilaterally, respirations unlabored  Heart:    Regular rate and rhythm  Neurologic:   Awake, alert, oriented x 3. No apparent focal neurological           defect.      UC Treatments / Results  Labs (all labs ordered are listed, but only abnormal results are displayed) Labs Reviewed  POCT INFLUENZA A/B    EKG   Radiology No results found.  Procedures Procedures (including critical care time)  Medications Ordered in UC Medications - No data to display  Initial Impression / Assessment and Plan / UC Course  I have reviewed the triage vital signs and the nursing notes.  Pertinent labs & imaging results that were available during my care of the patient were reviewed by me and considered in my medical decision making (see chart for details).    Influenza rapid test negative. Given symptoms and both children positive for influenza, treating for influenza illness. Tami-flu BID x 10 days, promethazine DM PRN for cough. Red flag precautions given indicating symptoms which warrant ER evaluation. RTC PRN Final Clinical Impressions(s) / UC Diagnoses   Final diagnoses:  Influenza-like illness     Discharge Instructions      Continue to alternate Tylenol and ibuprofen for management of fever. Force fluids to maintain hydration. Tamiflu twice daily for the next 5 days to reduce symptoms and course of influenza virus.  If you develop any shortness of  breath, wheezing or difficulty breathing go immediately to the nearest emergency department.      ED Prescriptions     Medication Sig Dispense Auth. Provider   promethazine-dextromethorphan (PROMETHAZINE-DM) 6.25-15 MG/5ML syrup Take 5 mLs by mouth 4 (four) times daily as needed for cough. 180 mL Scot Jun, FNP   oseltamivir (TAMIFLU) 75  MG capsule Take 1 capsule (75 mg total) by mouth 2 (two) times daily. 10 capsule Scot Jun, FNP      PDMP not reviewed this encounter.   Scot Jun, [place] 12/17/21 1241

## 2021-12-17 NOTE — Discharge Instructions (Signed)
Influenza test is positive. Continue to alternate Tylenol and ibuprofen for management of fever. Force fluids to maintain hydration. Tamiflu twice daily for the next 5 days to reduce symptoms and course of influenza virus.  If you develop any shortness of breath, wheezing or difficulty breathing go immediately to the nearest emergency department.

## 2021-12-17 NOTE — ED Triage Notes (Signed)
Patient presents to Urgent Care with complaints of body aches, cough, and fever since yesterday. Treating fever with Tylenol.

## 2022-02-19 ENCOUNTER — Other Ambulatory Visit: Payer: Self-pay

## 2022-02-19 ENCOUNTER — Ambulatory Visit
Admission: RE | Admit: 2022-02-19 | Discharge: 2022-02-19 | Disposition: A | Discharge status: Home / Self Care | Payer: 59 | Source: Ambulatory Visit | Attending: Family Medicine | Admitting: Family Medicine

## 2022-02-19 VITALS — BP 124/77 | HR 118 | Temp 100.8°F | Resp 18

## 2022-02-19 DIAGNOSIS — Z20822 Contact with and (suspected) exposure to covid-19: Secondary | ICD-10-CM

## 2022-02-19 DIAGNOSIS — R062 Wheezing: Secondary | ICD-10-CM | POA: Diagnosis not present

## 2022-02-19 DIAGNOSIS — J069 Acute upper respiratory infection, unspecified: Secondary | ICD-10-CM

## 2022-02-19 MED ORDER — PREDNISONE 20 MG PO TABS: 40.0000 mg | ORAL_TABLET | Freq: Every day | ORAL | 0 refills | Status: DC

## 2022-02-19 MED ORDER — BENZONATATE 100 MG PO CAPS: ORAL_CAPSULE | ORAL | 0 refills | Status: DC

## 2022-02-19 NOTE — ED Triage Notes (Signed)
Pt here with positive at-home covid test with sx starting on 2 days ago. Experiencing body aches, fatigue, cough, chest congestion and sore throat.  ?

## 2022-02-19 NOTE — ED Provider Notes (Signed)
?[place] ? ? ?[id number] ?02/19/22 Arrival Time: 6333 ? ?ASSESSMENT & PLAN: ? ?1. Viral URI with cough   ?2. Encounter for laboratory testing for COVID-19 virus   ?3. Wheezing   ? ?COVID testing sent at request. Work note provided. ?OTC symptom care as needed. ?No resp distress. Begin: ?New Prescriptions  ? BENZONATATE (TESSALON) 100 MG CAPSULE    Take 1 capsule by mouth every 8 (eight) hours for cough.  ? PREDNISONE (DELTASONE) 20 MG TABLET    Take 2 tablets (40 mg total) by mouth daily.  ? ? ? Follow-up Information   ? ? Sharyne Peach, MD.   ?Specialty: Family Medicine ?Why: If worsening or failing to improve as anticipated. ?Contact information: ?[address] ?[place] [address] ?[phone] ? ? ?  ?  ? ?  ?  ? ?  ? ? ?Reviewed expectations re: course of current medical issues. Questions answered. ?Outlined signs and symptoms indicating need for more acute intervention. ?Understanding verbalized. ?After Visit Summary given. ? ? ?SUBJECTIVE: ?History from: Patient. ?Valerie Pearson is a 41 y.o. female. Reports: body aches, fatigue, HA, cough; abrupt onset x 48 hours. Questions wheezing at times. Denies: difficulty breathing. With subj fever. Normal PO intake without n/v/d. COVID home test + today. ? ?OBJECTIVE: ? ?Vitals:  ? 02/19/22 1053  ?BP: 124/77  ?Pulse: (!) 118  ?Resp: 18  ?Temp: (!) 100.8 ?F (38.2 ?C)  ?TempSrc: Oral  ?SpO2: 98%  ?  ?General appearance: alert; no distress ?Eyes: PERRLA; EOMI; conjunctiva normal ?HENT: [place]; AT; with nasal congestion ?Neck: supple  ?Lungs: speaks full sentences without difficulty; unlabored; mild bilat exp wheezing ?Extremities: no edema ?Skin: warm and dry ?Neurologic: normal gait ?Psychological: alert and cooperative; normal mood and affect ? ?Labs: ? ?Labs Reviewed  ?NOVEL CORONAVIRUS, NAA  ? ? ? ?Allergies  ?Allergen Reactions  ? Iodinated Contrast Media Rash  ? ? ?Past Medical History:  ?Diagnosis Date  ? Anxiety   ? Depression   ?  Hypothyroidism   ? Migraines   ? Uterine fibroid   ? ?Social History  ? ?Socioeconomic History  ? Marital status: Married  ?  Spouse name: Not on file  ? Number of children: 3  ? Years of education: Not on file  ? Highest education level: Not on file  ?Occupational History  ? Not on file  ?Tobacco Use  ? Smoking status: Never  ? Smokeless tobacco: Never  ?Vaping Use  ? Vaping Use: Never used  ?Substance and Sexual Activity  ? Alcohol use: Not Currently  ? Drug use: Never  ? Sexual activity: Not on file  ?Other Topics Concern  ? Not on file  ?Social History Narrative  ? Not on file  ? ?Social Determinants of Health  ? ?Financial Resource Strain: Not on file  ?Food Insecurity: Not on file  ?Transportation Needs: Not on file  ?Physical Activity: Not on file  ?Stress: Not on file  ?Social Connections: Not on file  ?Intimate Partner Violence: Not on file  ? ?Family History  ?Family history unknown: Yes  ? ?Past Surgical History:  ?Procedure Laterality Date  ? CARPAL TUNNEL RELEASE Left 06/08/2020  ? Procedure: CARPAL TUNNEL RELEASE ENDOSCOPIC;  Surgeon: Corky Mull, MD;  Location: [facility] ORS;  Service: Orthopedics;  Laterality: Left;  ? CHOLECYSTECTOMY  2006  ? COLONOSCOPY  2013, 2018  ? DILATION AND CURETTAGE OF UTERUS  2014  ? ESOPHAGOGASTRODUODENOSCOPY  2018  ? TUBAL LIGATION  2016  ? ?  ?  Vanessa Kick, MD ?02/19/22 1118 ? ?

## 2022-02-20 LAB — NOVEL CORONAVIRUS, NAA: SARS-CoV-2, NAA: DETECTED — AB

## 2022-07-23 HISTORY — PX: ESOPHAGOGASTRODUODENOSCOPY: SHX1529

## 2022-07-26 ENCOUNTER — Encounter: Payer: Self-pay | Admitting: Emergency Medicine

## 2022-07-26 ENCOUNTER — Observation Stay: Payer: 59

## 2022-07-26 ENCOUNTER — Emergency Department: Payer: 59

## 2022-07-26 ENCOUNTER — Other Ambulatory Visit: Payer: Self-pay

## 2022-07-26 ENCOUNTER — Observation Stay
Admission: EM | Admit: 2022-07-26 | Discharge: 2022-07-27 | Disposition: A | Discharge status: Home / Self Care | Payer: 59 | Attending: Internal Medicine | Admitting: Internal Medicine

## 2022-07-26 DIAGNOSIS — Z79899 Other long term (current) drug therapy: Secondary | ICD-10-CM | POA: Diagnosis not present

## 2022-07-26 DIAGNOSIS — M5412 Radiculopathy, cervical region: Principal | ICD-10-CM | POA: Diagnosis present

## 2022-07-26 DIAGNOSIS — F32A Depression, unspecified: Secondary | ICD-10-CM | POA: Diagnosis present

## 2022-07-26 DIAGNOSIS — R7301 Impaired fasting glucose: Secondary | ICD-10-CM | POA: Diagnosis not present

## 2022-07-26 DIAGNOSIS — E039 Hypothyroidism, unspecified: Secondary | ICD-10-CM | POA: Diagnosis not present

## 2022-07-26 DIAGNOSIS — M25511 Pain in right shoulder: Secondary | ICD-10-CM | POA: Diagnosis present

## 2022-07-26 LAB — CBC WITH DIFFERENTIAL/PLATELET
Abs Immature Granulocytes: 0.09 10*3/uL — ABNORMAL HIGH (ref 0.00–0.07)
Basophils Absolute: 0 10*3/uL (ref 0.0–0.1)
Basophils Relative: 0 %
Eosinophils Absolute: 0 10*3/uL (ref 0.0–0.5)
Eosinophils Relative: 0 %
HCT: 38.2 % (ref 36.0–46.0)
Hemoglobin: 12 g/dL (ref 12.0–15.0)
Immature Granulocytes: 1 %
Lymphocytes Relative: 10 %
Lymphs Abs: 1.4 10*3/uL (ref 0.7–4.0)
MCH: 25.4 pg — ABNORMAL LOW (ref 26.0–34.0)
MCHC: 31.4 g/dL (ref 30.0–36.0)
MCV: 80.8 fL (ref 80.0–100.0)
Monocytes Absolute: 0.1 10*3/uL (ref 0.1–1.0)
Monocytes Relative: 1 %
Neutro Abs: 12.4 10*3/uL — ABNORMAL HIGH (ref 1.7–7.7)
Neutrophils Relative %: 88 %
Platelets: 385 10*3/uL (ref 150–400)
RBC: 4.73 MIL/uL (ref 3.87–5.11)
RDW: 13.8 % (ref 11.5–15.5)
WBC: 14.1 10*3/uL — ABNORMAL HIGH (ref 4.0–10.5)
nRBC: 0 % (ref 0.0–0.2)

## 2022-07-26 LAB — BASIC METABOLIC PANEL
Anion gap: 6 (ref 5–15)
BUN: 16 mg/dL (ref 6–20)
CO2: 24 mmol/L (ref 22–32)
Calcium: 9.3 mg/dL (ref 8.9–10.3)
Chloride: 107 mmol/L (ref 98–111)
Creatinine, Ser: 0.78 mg/dL (ref 0.44–1.00)
GFR, Estimated: >60 mL/min (ref >60)
Glucose, Bld: 167 mg/dL — ABNORMAL HIGH (ref 70–99)
Potassium: 4.4 mmol/L (ref 3.5–5.1)
Sodium: 137 mmol/L (ref 135–145)

## 2022-07-26 MED ORDER — OXYCODONE-ACETAMINOPHEN 5-325 MG PO TABS
1.0000 | ORAL_TABLET | Freq: Once | ORAL | Status: AC
  Administered 2022-07-26: 1 via ORAL
  Filled 2022-07-26: qty 1

## 2022-07-26 MED ORDER — METHYLPREDNISOLONE SODIUM SUCC 40 MG IJ SOLR
40.0000 mg | Freq: Two times a day (BID) | INTRAMUSCULAR | Status: DC
Start: 2022-07-26 — End: 2022-07-27
  Administered 2022-07-26 – 2022-07-27 (×2): 40 mg via INTRAVENOUS
  Filled 2022-07-26 (×2): qty 1

## 2022-07-26 MED ORDER — DULOXETINE HCL 20 MG PO CPEP
20.0000 mg | ORAL_CAPSULE | Freq: Every day | ORAL | Status: DC
  Administered 2022-07-26: 20 mg via ORAL
  Filled 2022-07-26 (×2): qty 1

## 2022-07-26 MED ORDER — CYCLOBENZAPRINE HCL 10 MG PO TABS
5.0000 mg | ORAL_TABLET | Freq: Three times a day (TID) | ORAL | Status: DC | PRN
  Administered 2022-07-26 – 2022-07-27 (×2): 5 mg via ORAL
  Filled 2022-07-26 (×2): qty 1

## 2022-07-26 MED ORDER — PANTOPRAZOLE SODIUM 40 MG PO TBEC
40.0000 mg | DELAYED_RELEASE_TABLET | Freq: Every day | ORAL | Status: DC
  Administered 2022-07-26 – 2022-07-27 (×2): 40 mg via ORAL
  Filled 2022-07-26 (×2): qty 1

## 2022-07-26 MED ORDER — LEVOTHYROXINE SODIUM 25 MCG PO TABS
25.0000 ug | ORAL_TABLET | Freq: Every day | ORAL | Status: DC
  Administered 2022-07-26: 25 ug via ORAL
  Filled 2022-07-26: qty 1

## 2022-07-26 MED ORDER — ONDANSETRON HCL 4 MG PO TABS: 4.0000 mg | ORAL_TABLET | Freq: Four times a day (QID) | ORAL | Status: DC | PRN

## 2022-07-26 MED ORDER — MORPHINE SULFATE (PF) 4 MG/ML IV SOLN
4.0000 mg | Freq: Once | INTRAVENOUS | Status: AC
  Administered 2022-07-26: 4 mg via INTRAVENOUS
  Filled 2022-07-26: qty 1

## 2022-07-26 MED ORDER — HYDROCODONE-ACETAMINOPHEN 5-325 MG PO TABS
1.0000 | ORAL_TABLET | ORAL | Status: DC | PRN
  Administered 2022-07-26 – 2022-07-27 (×3): 1 via ORAL
  Filled 2022-07-26 (×3): qty 1

## 2022-07-26 MED ORDER — HYDROMORPHONE HCL 1 MG/ML IJ SOLN
1.0000 mg | INTRAMUSCULAR | Status: DC | PRN
  Administered 2022-07-26: 1 mg via INTRAVENOUS
  Filled 2022-07-26: qty 1

## 2022-07-26 MED ORDER — METHYLPREDNISOLONE SODIUM SUCC 125 MG IJ SOLR
125.0000 mg | Freq: Once | INTRAMUSCULAR | Status: AC
Start: 2022-07-26 — End: 2022-07-26
  Administered 2022-07-26: 125 mg via INTRAVENOUS
  Filled 2022-07-26: qty 2

## 2022-07-26 MED ORDER — HYDROMORPHONE HCL 1 MG/ML IJ SOLN
1.0000 mg | Freq: Once | INTRAMUSCULAR | Status: AC
  Administered 2022-07-26: 1 mg via INTRAVENOUS
  Filled 2022-07-26: qty 1

## 2022-07-26 MED ORDER — ONDANSETRON HCL 4 MG/2ML IJ SOLN
4.0000 mg | Freq: Once | INTRAMUSCULAR | Status: AC
  Administered 2022-07-26: 4 mg via INTRAVENOUS
  Filled 2022-07-26: qty 2

## 2022-07-26 MED ORDER — ONDANSETRON HCL 4 MG/2ML IJ SOLN: 4.0000 mg | Freq: Four times a day (QID) | INTRAMUSCULAR | Status: DC | PRN

## 2022-07-26 MED ORDER — KETOROLAC TROMETHAMINE 30 MG/ML IJ SOLN
30.0000 mg | Freq: Four times a day (QID) | INTRAMUSCULAR | Status: DC | PRN
  Filled 2022-07-26: qty 1

## 2022-07-26 MED ORDER — FENTANYL CITRATE PF 50 MCG/ML IJ SOSY
50.0000 ug | PREFILLED_SYRINGE | Freq: Once | INTRAMUSCULAR | Status: AC
  Administered 2022-07-26: 50 ug via INTRAMUSCULAR
  Filled 2022-07-26: qty 1

## 2022-07-26 NOTE — Assessment & Plan Note (Addendum)
Conservative management as per neurosurgery.  She will follow-up with neurosurgery as outpatient.  We will give prednisone taper upon discharge, as needed meds relaxant and a few pain pills.  Note for light duty at work without any heavy lifting.

## 2022-07-26 NOTE — ED Provider Notes (Signed)
[facility] Provider Note    Event Date/Time   First MD Initiated Contact with Patient 07/26/22 [phone]     (approximate)   History   Shoulder Pain   HPI  Valerie Pearson is a 41 y.o. 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  Patient states its been severe.  Has taken every medication she has at home including muscle relaxers, gabapentin, pain medication.  No relief with these medications overnight.  Called her doctor who had given her a injection and steroid yesterday and they told her to come to the ED      Physical Exam   Triage Vital Signs: ED Triage Vitals  Enc Vitals Group     BP 07/26/22 0852 (!) 134/107     Pulse Rate 07/26/22 0852 (!) 105     Resp 07/26/22 0852 18     Temp 07/26/22 0852 98.7 F (37.1 C)     Temp Source 07/26/22 0852 Oral     SpO2 07/26/22 0852 100 %     Weight 07/26/22 0853 155 lb (70.3 kg)     Height 07/26/22 0853 '5\' 1"'$  (1.549 m)     Head Circumference --      Peak Flow --      Pain Score 07/26/22 0853 10     Pain Loc --      Pain Edu? --      Excl. in [place]? --     Most recent vital signs: Vitals:   07/26/22 0852 07/26/22 1427  BP: (!) 134/107 131/85  Pulse: (!) 105 98  Resp: 18 18  Temp: 98.7 F (37.1 C)   SpO2: 100% 100%     General: Awake, no distress.   CV:  Good peripheral perfusion. regular rate and  rhythm Resp:  Normal effort.  Abd:  No distention.   Other:  C-spine and trapezius tender to palpation, pain is reproduced with palpation, grips are weaker but equal bilaterally, neurovascular is intact, patient is very tearful   ED Results / Procedures / Treatments   Labs (all labs ordered are listed, but only abnormal results are displayed) Labs Reviewed  BASIC METABOLIC PANEL - Abnormal; Notable for the following components:      Result Value   Glucose, Bld 167 (*)    All other  components within normal limits  CBC WITH DIFFERENTIAL/PLATELET - Abnormal; Notable for the following components:   WBC 14.1 (*)    MCH 25.4 (*)    Neutro Abs 12.4 (*)    Abs Immature Granulocytes 0.09 (*)    All other components within normal limits     EKG     RADIOLOGY X-rays C-spine MRI C-spine    PROCEDURES:   Procedures   MEDICATIONS ORDERED IN ED: Medications  fentaNYL (SUBLIMAZE) injection 50 mcg (50 mcg Intramuscular Given 07/26/22 1019)  HYDROmorphone (DILAUDID) injection 1 mg (1 mg Intravenous Given 07/26/22 1203)  ondansetron (ZOFRAN) injection 4 mg (4 mg Intravenous Given 07/26/22 1203)  methylPREDNISolone sodium succinate (SOLU-MEDROL) 125 mg/2 mL injection 125 mg (125 mg Intravenous Given 07/26/22 1242)  oxyCODONE-acetaminophen (PERCOCET/ROXICET) 5-325 MG per tablet 1 tablet (1 tablet Oral Given 07/26/22 1350)  morphine (PF) 4 MG/ML injection 4 mg (4 mg Intravenous Given 07/26/22 1438)     IMPRESSION / MDM / [place] / ED COURSE  I reviewed the triage vital signs and the nursing notes.  Differential diagnosis includes, but is not limited to, muscle spasm, disc herniation, cervical radiculopathy, pinched nerve  Patient's presentation is most consistent with acute complicated illness / injury requiring diagnostic workup.   X-ray of the C-spine independently reviewed and interpreted by me as being negative  Patient is very tearful.  We gave her fentanyl 50 mcg IM.  Due to the x-ray being negative and the severe amount of pain with cervical radiculopathy will order MRI of the C-spine   MRI of the C-spine shows compression along C7 on the right side . Consult to neurosurgery.  Dr. Cari Caraway states she can follow-up outpatient.  Start her on a steroid taper and muscle relaxers.  Patient continues to have a great deal of pain.  Put IV and give her 1 mg of Dilaudid and 125 mg of Solu-Medrol.  On recheck of the patient  later she is still having some pain that is better as it was but is afraid to go home as she is afraid the pain will get worse.  Told her we could try Percocet here in the ED to see if that controls it for about another hour and if not then we will plan to admit her for pain control.  Plan at this time is to hopefully discharge patient with a steroid pack muscle relaxer and pain medication this strict follow-up with Dr. Cari Caraway.  She continues to have equal grips bilaterally, equal strength bilaterally in the arms.  ----------------------------------------- 2:34 PM on 07/26/2022 ----------------------------------------- Patient continues to complain of more pain.  We will admit the patient for pain control.  Instructed nursing staff to do basic labs.  Consult to hospitalist for admission for pain control.  Spoke with Dr. Francine Graven.  I will also notify Dr. Cari Caraway that we need to admit patient for pain control.  She is in stable condition.  FINAL CLINICAL IMPRESSION(S) / ED DIAGNOSES   Final diagnoses:  Cervical radiculopathy     Rx / DC Orders   ED Discharge Orders     None        Note:  This document was prepared using Dragon voice recognition software and may include unintentional dictation errors.    Versie Starks, PA-C 07/26/22 1530    Carrie Mew, MD 07/26/22 [phone]

## 2022-07-26 NOTE — Assessment & Plan Note (Addendum)
Continue Cymbalta.

## 2022-07-26 NOTE — Assessment & Plan Note (Addendum)
Continue Synthroid °

## 2022-07-26 NOTE — Consult Note (Signed)
Referring Physician:  No referring provider defined for this encounter.  Primary Physician:  Sharyne Peach, MD  History of Present Illness: 07/26/2022 [name] is here today with a chief complaint of R neck and arm pain.  She presented to the emergency department with approximately 24 hours of severe pain.  She reports pain into her R shoulder blade through her upper arm to her forearm and 2nd-4th fingers.  She denies weakness.  She has some discomfort in her R pectoral muscle.  She has never had this issue before.  She does report some R shoulder pain.  Kandis Cocking has no symptoms of cervical myelopathy.  The symptoms are causing a significant impact on the patient's life.   Review of Systems:  A 10 point review of systems is negative, except for the pertinent positives and negatives detailed in the HPI.  Past Medical History: Past Medical History:  Diagnosis Date   Anxiety    Depression    Hypothyroidism    Migraines    Uterine fibroid     Past Surgical History: Past Surgical History:  Procedure Laterality Date   CARPAL TUNNEL RELEASE Left 06/08/2020   Procedure: CARPAL TUNNEL RELEASE ENDOSCOPIC;  Surgeon: Corky Mull, MD;  Location: [facility] ORS;  Service: Orthopedics;  Laterality: Left;   CHOLECYSTECTOMY  2006   COLONOSCOPY  2013, 2018   DILATION AND CURETTAGE OF UTERUS  2014   ESOPHAGOGASTRODUODENOSCOPY  2018   TUBAL LIGATION  2016    Allergies: Allergies as of 07/26/2022 - Review Complete 07/26/2022  Allergen Reaction Noted   Iodinated contrast media Rash 10/12/2013    Medications: No outpatient medications have been marked as taking for the 07/26/22 encounter [facility] Encounter).    Social History: Social History   Tobacco Use   Smoking status: Never   Smokeless tobacco: Never  Vaping Use   Vaping Use: Never used  Substance Use Topics   Alcohol use: Not Currently   Drug use: Never    Family Medical History: Family  History  Family history unknown: Yes    Physical Examination: Vitals:   07/26/22 1542 07/26/22 1639  BP: 134/82 125/80  Pulse: 95 80  Resp: 15 18  Temp: 98.5 F (36.9 C) 98.3 F (36.8 C)  SpO2: 100% 99%    General: Patient is well developed, well nourished, calm, collected, and in moderate distress. Attention to examination is appropriate.  Neck:   Supple.  Full range of motion with discomfort.  Respiratory: Patient is breathing without any difficulty.   NEUROLOGICAL:     Awake, alert, oriented to person, place, and time.  Speech is clear and fluent. Fund of knowledge is appropriate.   Cranial Nerves: Pupils equal round and reactive to light.  Facial tone is symmetric.  Facial sensation is symmetric. Shoulder shrug is symmetric. Tongue protrusion is midline.  There is no pronator drift.  ROM of spine: full.    Strength: Side Biceps Triceps Deltoid Interossei Grip Wrist Ext. Wrist Flex.  R '5 5 5 5 5 5 5  '$ L '5 5 5 5 5 5 5   '$ Side Iliopsoas Quads Hamstring PF DF EHL  R '5 5 5 5 5 5  '$ L '5 5 5 5 5 5   '$ Reflexes are 1+ and symmetric at the biceps, triceps, brachioradialis, patella and achilles.   Hoffman's is absent.  Clonus is not present.  Toes are down-going.  Bilateral upper and lower extremity sensation is intact to light touch.  No evidence of dysmetria noted.  Gait is untested.    Medical Decision Making  Imaging: MRI C spine 07/26/22 IMPRESSION: Multilevel degenerative changes as detailed above. 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     Electronically Signed   By: Macy Mis M.D.   On: 07/26/2022 11:43  I have personally reviewed the images and agree with the above interpretation.  Assessment and Plan: Valerie Pearson is a pleasant 41 y.o. female with probable R cervical radiculopathy. She has concordant MRI findings.  - Steroids, muscle relaxant, and PRN pain meds as appropriate - PTOT when she is able - She  is likely to get over the acute pain exacerbation, and then would benefit from outpatient PTOT and possible injections. - If her condition worsens, could consider C6-7 intervention but that is not normally performed in urgent fashion for radiculopathy  Thank you for involving me in the care of this patient.      Joandry Slagter K. Izora Ribas MD, [facility] Neurosurgery

## 2022-07-26 NOTE — ED Triage Notes (Signed)
Patient to ED for right shoulder/ neck pain. Patient states pain radiates down arm. Patient seen by PCP yesterday and given prednisone and a shot. Pain worse today.

## 2022-07-26 NOTE — ED Notes (Signed)
ED Provider at bedside. 

## 2022-07-26 NOTE — H&P (Signed)
History and Physical    [name] UMP:[id number] DOB: 05/16/1981 DOA: 07/26/2022 DOS: the patient was seen and examined on 07/26/2022 PCP: Sharyne Peach, MD  Patient coming from: Home  Chief Complaint:  Chief Complaint  Patient presents with   Shoulder Pain   HPI: Valerie Pearson is a 41 y.o. female with medical history significant for hypothyroidism and depression who presents to the ER for evaluation of sudden onset neck pain which started about 2 AM the day prior to her presentation with radiation down her right shoulder and right arm.  She rates her pain a 10 x 10 in intensity at its worst.  She had taken muscle relaxants and gabapentin without any improvement in her symptoms. She saw her primary care provider who gave her a dose of Toradol without any significant improvement in her symptoms and so she decided to come to the ER. Patient denies any recent trauma or falls.  She denies having any chest pain, no shortness of breath, no headache, no dizziness, no lightheadedness, no fever, no chills, no nausea, no vomiting, no abdominal pain, no changes in her bowel habits. She received IV Dilaudid and morphine in the ER without any significant improvement in her symptoms. She will be referred to observation status for further evaluation.    Review of Systems: As mentioned in the history of present illness. All other systems reviewed and are negative. Past Medical History:  Diagnosis Date   Anxiety    Depression    Hypothyroidism    Migraines    Uterine fibroid    Past Surgical History:  Procedure Laterality Date   CARPAL TUNNEL RELEASE Left 06/08/2020   Procedure: CARPAL TUNNEL RELEASE ENDOSCOPIC;  Surgeon: Corky Mull, MD;  Location: [facility] ORS;  Service: Orthopedics;  Laterality: Left;   CHOLECYSTECTOMY  2006   COLONOSCOPY  2013, 2018   DILATION AND CURETTAGE OF UTERUS  2014   ESOPHAGOGASTRODUODENOSCOPY  2018   TUBAL LIGATION  2016   Social History:   reports that she has never smoked. She has never used smokeless tobacco. She reports that she does not currently use alcohol. She reports that she does not use drugs.  Allergies  Allergen Reactions   Iodinated Contrast Media Rash    Family History  Family history unknown: Yes    Prior to Admission medications   Medication Sig Start Date End Date Taking? Authorizing Provider  acetaminophen (TYLENOL) 500 MG tablet Take 1,000 mg by mouth every 6 (six) hours as needed for moderate pain.    [provider]  benzonatate (TESSALON) 100 MG capsule Take 1 capsule by mouth every 8 (eight) hours for cough. 02/19/22   Vanessa Kick, MD  Biotin w/ Vitamins C & E (HAIR/SKIN/NAILS PO) Take 1 tablet by mouth at bedtime.     [provider]  cetirizine (ZYRTEC) 10 MG tablet Take 1 tablet (10 mg total) by mouth daily. 11/01/20   Loura Halt A, NP  clobetasol cream (TEMOVATE) 4.00 % Apply 1 application topically 2 (two) times daily as needed (psoriasis).  03/10/20   [provider]  DULoxetine (CYMBALTA) 20 MG capsule Take 20 mg by mouth at bedtime.  04/29/20   [provider]  fluticasone (FLONASE) 50 MCG/ACT nasal spray Place 2 sprays into both nostrils daily. 11/01/20   Loura Halt A, NP  HYDROcodone-acetaminophen (NORCO) 5-325 MG tablet Take 1-2 tablets by mouth every 6 (six) hours as needed for moderate pain or severe pain. MAXIMUM TOTAL ACETAMINOPHEN DOSE IS  4000 MG PER DAY 06/08/20   Poggi, Marshall Cork, MD  levothyroxine (SYNTHROID) 25 MCG tablet Take 25 mcg by mouth at bedtime.  03/27/20   [provider]  oseltamivir (TAMIFLU) 75 MG capsule Take 1 capsule (75 mg total) by mouth 2 (two) times daily. 12/17/21   Scot Jun, FNP  predniSONE (DELTASONE) 20 MG tablet Take 2 tablets (40 mg total) by mouth daily. 02/19/22   Vanessa Kick, MD  promethazine-dextromethorphan (PROMETHAZINE-DM) 6.25-15 MG/5ML syrup Take 5 mLs by mouth 4 (four) times daily as needed for cough.  12/17/21   Scot Jun, FNP    Physical Exam: Vitals:   07/26/22 5397 07/26/22 0853 07/26/22 1427 07/26/22 1542  BP: (!) 134/107  131/85 134/82  Pulse: (!) 105  98 95  Resp: '18  18 15  '$ Temp: 98.7 F (37.1 C)   98.5 F (36.9 C)  TempSrc: Oral   Oral  SpO2: 100%  100% 100%  Weight:  70.3 kg    Height:  '5\' 1"'$  (1.549 m)     Physical Exam Vitals and nursing note reviewed.  Constitutional:      Appearance: Normal appearance.  HENT:     Head: Normocephalic and atraumatic.     Nose: Nose normal.     Mouth/Throat:     Mouth: Mucous membranes are moist.  Eyes:     Conjunctiva/sclera: Conjunctivae normal.  Neck:     Comments: Paraspinal muscle tenderness Cardiovascular:     Rate and Rhythm: Normal rate and regular rhythm.  Pulmonary:     Effort: Pulmonary effort is normal.     Breath sounds: Normal breath sounds.  Abdominal:     General: Abdomen is flat. Bowel sounds are normal.     Palpations: Abdomen is soft.  Musculoskeletal:        General: Normal range of motion.     Cervical back: Normal range of motion.  Skin:    General: Skin is warm and dry.  Neurological:     General: No focal deficit present.     Mental Status: She is alert and oriented to person, place, and time.  Psychiatric:        Mood and Affect: Mood normal.        Behavior: Behavior normal.     Data Reviewed: Relevant notes from primary care and specialist visits, past discharge summaries as available in EHR, including Care Everywhere. Prior diagnostic testing as pertinent to current admission diagnoses Updated medications and problem lists for reconciliation ED course, including vitals, labs, imaging, treatment and response to treatment Triage notes, nursing and pharmacy notes and ED provider's notes Notable results as noted in HPI Labs reviewed.  Sodium 137, potassium 4.4, chloride 107, bicarb 24, glucose 167, BUN 16, creatinine 0.78, calcium 9.3, white count 14.9, hemoglobin 12, hematocrit  38.2, RDW 13.8, platelet count 385 Right shoulder x-ray shows No acute osseous findings. Mild acromioclavicular degenerative changes. MRI of the cervical spine shows Multilevel degenerative changes as detailed above. 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 There are no new results to review at this time.  Assessment and Plan: * Cervical radiculopathy Pain control Continue systemic steroids As needed hydrocodone and as needed Flexeril Neurosurgery consult  Depression Stable Continue Cymbalta  Hypothyroidism Stable Continue Synthroid      Advance Care Planning:   Code Status: Full Code   Consults: Neurosurgery  Family Communication: Greater than 50% of time was spent discussing plan of care with patient  at the bedside.  All questions and concerns have been addressed.  She verbalizes understanding and agrees to the plan.    Severity of Illness: The appropriate patient status for this patient is OBSERVATION. Observation status is judged to be reasonable and necessary in order to provide the required intensity of service to ensure the patient's safety. The patient's presenting symptoms, physical exam findings, and initial radiographic and laboratory data in the context of their medical condition is felt to place them at decreased risk for further clinical deterioration. Furthermore, it is anticipated that the patient will be medically stable for discharge from the hospital within 2 midnights of admission.   Author: Collier Bullock, MD 07/26/2022 4:26 PM  For on call review [email or web address].

## 2022-07-27 DIAGNOSIS — E039 Hypothyroidism, unspecified: Secondary | ICD-10-CM | POA: Diagnosis not present

## 2022-07-27 DIAGNOSIS — F32A Depression, unspecified: Secondary | ICD-10-CM | POA: Diagnosis not present

## 2022-07-27 DIAGNOSIS — R7301 Impaired fasting glucose: Secondary | ICD-10-CM

## 2022-07-27 DIAGNOSIS — M5412 Radiculopathy, cervical region: Secondary | ICD-10-CM | POA: Diagnosis not present

## 2022-07-27 LAB — CBC
HCT: 34.3 % — ABNORMAL LOW (ref 36.0–46.0)
Hemoglobin: 11 g/dL — ABNORMAL LOW (ref 12.0–15.0)
MCH: 26.1 pg (ref 26.0–34.0)
MCHC: 32.1 g/dL (ref 30.0–36.0)
MCV: 81.3 fL (ref 80.0–100.0)
Platelets: 366 10*3/uL (ref 150–400)
RBC: 4.22 MIL/uL (ref 3.87–5.11)
RDW: 13.9 % (ref 11.5–15.5)
WBC: 16.7 10*3/uL — ABNORMAL HIGH (ref 4.0–10.5)
nRBC: 0 % (ref 0.0–0.2)

## 2022-07-27 LAB — HIV ANTIBODY (ROUTINE TESTING W REFLEX): HIV Screen 4th Generation wRfx: NONREACTIVE

## 2022-07-27 LAB — BASIC METABOLIC PANEL
Anion gap: 6 (ref 5–15)
BUN: 18 mg/dL (ref 6–20)
CO2: 22 mmol/L (ref 22–32)
Calcium: 8.7 mg/dL — ABNORMAL LOW (ref 8.9–10.3)
Chloride: 107 mmol/L (ref 98–111)
Creatinine, Ser: 0.69 mg/dL (ref 0.44–1.00)
GFR, Estimated: >60 mL/min (ref >60)
Glucose, Bld: 169 mg/dL — ABNORMAL HIGH (ref 70–99)
Potassium: 4.3 mmol/L (ref 3.5–5.1)
Sodium: 135 mmol/L (ref 135–145)

## 2022-07-27 MED ORDER — PREDNISONE 10 MG PO TABS: ORAL_TABLET | ORAL | 0 refills | Status: DC

## 2022-07-27 MED ORDER — HYDROCODONE-ACETAMINOPHEN 5-325 MG PO TABS: 1.0000 | ORAL_TABLET | Freq: Four times a day (QID) | ORAL | 0 refills | Status: DC | PRN

## 2022-07-27 MED ORDER — CYCLOBENZAPRINE HCL 5 MG PO TABS
5.0000 mg | ORAL_TABLET | Freq: Three times a day (TID) | ORAL | 0 refills | Status: DC | PRN
Start: 2022-07-27 — End: 2022-08-09

## 2022-07-27 NOTE — TOC Initial Note (Signed)
Transition of Care [facility]) - Initial/Assessment Note    Patient Details  Name: Valerie Pearson MRN: [id number] Date of Birth: 08/06/1981  Transition of Care [facility]) CM/SW Contact:    Conception Oms, RN Phone Number: 07/27/2022, 9:23 AM  Clinical Narrative:                   Transition of Care (TOC) Screening Note   Patient Details  Name: Valerie Pearson Date of Birth: [birth year]   Transition of Care [facility]) CM/SW Contact:    Conception Oms, RN Phone Number: 07/27/2022, 9:23 AM    Transition of Care Department [facility]) has reviewed patient and no TOC needs have been identified at this time. We will continue to monitor patient advancement through interdisciplinary progression rounds. If new patient transition needs arise, please place a TOC consult.         Patient Goals and CMS Choice        Expected Discharge Plan and Services                                                Prior Living Arrangements/Services                       Activities of Daily Living Home Assistive Devices/Equipment: None ADL Screening (condition at time of admission) Patient's cognitive ability adequate to safely complete daily activities?: Yes Is the patient deaf or have difficulty hearing?: No Does the patient have difficulty seeing, even when wearing glasses/contacts?: No Does the patient have difficulty concentrating, remembering, or making decisions?: No Patient able to express need for assistance with ADLs?: Yes Does the patient have difficulty dressing or bathing?: No Independently performs ADLs?: Yes (appropriate for developmental age) Does the patient have difficulty walking or climbing stairs?: No Weakness of Legs: None Weakness of Arms/Hands: None  Permission Sought/Granted                  Emotional Assessment              Admission diagnosis:  Cervical radiculopathy [M54.12] Patient Active Problem List   Diagnosis Date Noted    Cervical radiculopathy 07/26/2022   Hypothyroidism    Depression    PCP:  Sharyne Peach, MD Pharmacy:   [facility] [address] [place] [address] Phone: [phone] Fax: [phone]     Social Determinants of Health (SDOH) Interventions    Readmission Risk Interventions     No data to display

## 2022-07-27 NOTE — Assessment & Plan Note (Signed)
Sugars likely secondary to steroids.

## 2022-07-27 NOTE — Discharge Instructions (Signed)
No driving while on pain meds or muscle relaxer

## 2022-07-27 NOTE — Discharge Summary (Signed)
Physician Discharge Summary   [name] MRN: [id number] DOB: 18-Feb-1981  Admit date:     07/26/2022  Discharge date: 07/27/22  Discharge Physician: Loletha Grayer   PCP: Sharyne Peach, MD   Recommendations at discharge:   Follow-up PCP 5 days Follow-up neurosurgery  Discharge Diagnoses: Principal Problem:   Cervical radiculopathy Active Problems:   Hypothyroidism   Depression   Impaired fasting glucose    Hospital Course: The patient was brought in as an observation on 07/26/2022 and discharged on 07/27/2022.  Came in with shoulder pain and was diagnosed with cervical radiculopathy with an MRI of the cervical spine showing C6-C7 disc herniation compressing on the right C7 nerve root.  The patient was started on Solu-Medrol and muscle relaxants and pain medication.  Patient did feel little bit better on the day of discharge and was moving her arm around.  Case discussed with Dr. Cari Caraway neurosurgery and recommended outpatient follow-up.  Prescribed prednisone taper, muscle relaxants and a few pain pills.  Assessment and Plan: * Cervical radiculopathy Conservative management as per neurosurgery.  She will follow-up with neurosurgery as outpatient.  We will give prednisone taper upon discharge, as needed meds relaxant and a few pain pills.  Note for light duty at work without any heavy lifting.  Hypothyroidism Continue Synthroid  Depression Continue Cymbalta  Impaired fasting glucose Sugars likely secondary to steroids.         Consultants: Neurosurgery Procedures performed: None Disposition: Home Diet recommendation:  Regular diet DISCHARGE MEDICATION: Allergies as of 07/27/2022       Reactions   Iodinated Contrast Media Rash        Medication List     STOP taking these medications    benzonatate 100 MG capsule Commonly known as: TESSALON   oseltamivir 75 MG capsule Commonly known as: TAMIFLU   promethazine-dextromethorphan  6.25-15 MG/5ML syrup Commonly known as: PROMETHAZINE-DM       TAKE these medications    acetaminophen 500 MG tablet Commonly known as: TYLENOL Take 1,000 mg by mouth every 6 (six) hours as needed for moderate pain.   cetirizine 10 MG tablet Commonly known as: ZYRTEC Take 1 tablet (10 mg total) by mouth daily.   clobetasol cream 0.05 % Commonly known as: TEMOVATE Apply 1 application topically 2 (two) times daily as needed (psoriasis).   cyclobenzaprine 5 MG tablet Commonly known as: FLEXERIL Take 1 tablet (5 mg total) by mouth 3 (three) times daily as needed for muscle spasms.   DULoxetine 20 MG capsule Commonly known as: CYMBALTA Take 20 mg by mouth at bedtime.   fluticasone 50 MCG/ACT nasal spray Commonly known as: FLONASE Place 2 sprays into both nostrils daily.   HAIR/SKIN/NAILS PO Take 1 tablet by mouth at bedtime.   HYDROcodone-acetaminophen 5-325 MG tablet Commonly known as: NORCO/VICODIN Take 1 tablet by mouth every 6 (six) hours as needed for severe pain. What changed:  how much to take reasons to take this additional instructions   levothyroxine 25 MCG tablet Commonly known as: SYNTHROID Take 25 mcg by mouth at bedtime.   predniSONE 10 MG tablet Commonly known as: DELTASONE 4 tabs po day1,2; 3 tabs po day3,4,5 ; 2 tabs po day6,7,8 ; 1 tab po day9,10,11; 1/2 tab po day12,13 and 14 What changed:  medication strength how much to take how to take this when to take this additional instructions        Follow-up Information     Sharyne Peach, MD Follow up in  5 day(s).   Specialty: Family Medicine Contact information: [place] [address] [phone]         Meade Maw, MD Follow up in 1 week(s).   Specialty: Neurosurgery Contact information: [place] [address] [phone]                Discharge Exam: Danley Danker Weights   07/26/22 0853  Weight: 70.3 kg   Physical  Exam HENT:     Head: Normocephalic.     Mouth/Throat:     Pharynx: No oropharyngeal exudate.  Eyes:     General: Lids are normal.     Conjunctiva/sclera: Conjunctivae normal.  Cardiovascular:     Rate and Rhythm: Normal rate and regular rhythm.     Heart sounds: Normal heart sounds, S1 normal and S2 normal.  Pulmonary:     Breath sounds: No decreased breath sounds, wheezing, rhonchi or rales.  Abdominal:     Palpations: Abdomen is soft.     Tenderness: There is no abdominal tenderness.  Musculoskeletal:     Right shoulder: No swelling.     Right elbow: No swelling.     Comments: Good range of motion right elbow.  Good range of motion right shoulder.  Neurological:     Mental Status: She is alert and oriented to person, place, and time.     Comments: Power 5 out of 5 right upper extremity.  Painful sensation right lateral arm near the elbow.       Condition at discharge: stable  The results of significant diagnostics from this hospitalization (including imaging, microbiology, ancillary and laboratory) are listed below for reference.   Imaging Studies: DG Cervical Spine Complete  Result Date: 07/26/2022 CLINICAL DATA:  Chronic neck pain. EXAM: CERVICAL SPINE - COMPLETE 4+ VIEW COMPARISON:  August 20, 2018 FINDINGS: There is no evidence of cervical spine fracture or prevertebral soft tissue swelling. There is persistent straightening of the normal cervical spine lordosis. No other significant bone abnormalities are identified. IMPRESSION: No acute osseous abnormality within the cervical spine. Electronically Signed   By: Virgina Norfolk M.D.   On: 07/26/2022 20:24   MR Cervical Spine Wo Contrast  Result Date: 07/26/2022 CLINICAL DATA:  Cervical radiculopathy EXAM: MRI CERVICAL SPINE WITHOUT CONTRAST TECHNIQUE: Multiplanar, multisequence MR imaging of the cervical spine was performed. No intravenous contrast was administered. COMPARISON:  None Available. FINDINGS: Alignment: No  significant listhesis. Vertebrae: Vertebral body heights are maintained. No marrow edema. No suspicious osseous lesion. Cord: Normal caliber and signal. Posterior Fossa, vertebral arteries, paraspinal tissues: Unremarkable. Disc levels: C2-C3:  No canal or foraminal stenosis. C3-C4:  No canal or foraminal stenosis. C4-C5:  No canal or foraminal stenosis. C5-C6:  Disc bulge.  No canal or foraminal stenosis. C6-C7: Disc bulge with superimposed right paracentral extrusion extending slightly above and below disc level. This likely compresses the traversing right C7 nerve root. Mild canal stenosis. No left foraminal stenosis. C7-T1:  No canal or foraminal stenosis. IMPRESSION: Multilevel degenerative changes as detailed above. 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 Electronically Signed   By: Macy Mis M.D.   On: 07/26/2022 11:43   DG Shoulder Right Port  Result Date: 07/26/2022 CLINICAL DATA:  Neck and right shoulder pain. EXAM: RIGHT SHOULDER - 1 VIEW COMPARISON:  None Available. FINDINGS: Two AP views and a Y-view are submitted. The mineralization and alignment are normal. There is no evidence of acute fracture or dislocation.  The subacromial space is preserved. Mild acromioclavicular degenerative changes. No soft tissue abnormalities are identified. IMPRESSION: No acute osseous findings. Mild acromioclavicular degenerative changes. Electronically Signed   By: Richardean Sale M.D.   On: 07/26/2022 09:28    Microbiology: Results for orders placed or performed during the hospital encounter of 02/19/22  Novel Coronavirus, NAA ([facility])     Status: Abnormal   Collection Time: 02/19/22 11:03 AM   Specimen: Nasopharyngeal Swab; Nasopharyngeal(NP) swabs in vial transport medium   Nasopharynge  Patient  Result Value Ref Range Status   SARS-CoV-2, NAA Detected (A) Not Detected Final    Comment: Patients who have a positive COVID-19 test result may now have treatment  options. Treatment options are available for patients with mild to moderate symptoms and for hospitalized patients. Visit our website at [email or web address] for resources and information. This nucleic acid amplification test was developed and its performance characteristics determined by [employer]. Nucleic acid amplification tests include RT-PCR and TMA. This test has not been FDA cleared or approved. This test has been authorized by FDA under an Emergency Use Authorization (EUA). This test is only authorized for the duration of time the declaration that circumstances exist justifying the authorization of the emergency use of in vitro diagnostic tests for detection of SARS-CoV-2 virus and/or diagnosis of COVID-19 infection under section 564(b)(1) of the Act, 21 U.S.C. 263ZCH-8(I) (1), unless the authorization is terminated or revoked sooner. When diagnostic testing is negativ e, the possibility of a false negative result should be considered in the context of a patient's recent exposures and the presence of clinical signs and symptoms consistent with COVID-19. An individual without symptoms of COVID-19 and who is not shedding SARS-CoV-2 virus would expect to have a negative (not detected) result in this assay.     Labs: CBC: Recent Labs  Lab 07/26/22 1434 07/27/22 0512  WBC 14.1* 16.7*  NEUTROABS 12.4*  --   HGB 12.0 11.0*  HCT 38.2 34.3*  MCV 80.8 81.3  PLT 385 502   Basic Metabolic Panel: Recent Labs  Lab 07/26/22 1434 07/27/22 0512  NA 137 135  K 4.4 4.3  CL 107 107  CO2 24 22  GLUCOSE 167* 169*  BUN 16 18  CREATININE 0.78 0.69  CALCIUM 9.3 8.7*     Discharge time spent: greater than 30 minutes.  Signed: Loletha Grayer, MD [facility] 07/27/2022

## 2022-08-02 ENCOUNTER — Ambulatory Visit: Payer: Self-pay | Admitting: Neurosurgery

## 2022-08-09 ENCOUNTER — Ambulatory Visit: Payer: BC Managed Care – PPO | Admitting: Neurosurgery

## 2022-08-09 ENCOUNTER — Encounter: Payer: Self-pay | Admitting: Neurosurgery

## 2022-08-09 VITALS — BP 124/82 | HR 114 | Ht 61.0 in | Wt 155.6 lb

## 2022-08-09 DIAGNOSIS — M5412 Radiculopathy, cervical region: Secondary | ICD-10-CM

## 2022-08-09 NOTE — Progress Notes (Signed)
Referring Physician:  No referring provider defined for this encounter.   Primary Physician:  Sharyne Peach, MD   History of Present Illness:  08/09/2022 [name] is to have severe pain in her right arm.  It is changed a small amount, but continues to bother her.  She can only sleep in certain positions.  Steroids helped a small amount.  Gabapentin has taken the edge off.  She does not feel that the oxycodone has helped her very much.   07/26/2022 [name] is here today with a chief complaint of R neck and arm pain.  She presented to the emergency department with approximately 24 hours of severe pain.  She reports pain into her R shoulder blade through her upper arm to her forearm and 2nd-4th fingers.  She denies weakness.  She has some discomfort in her R pectoral muscle.   She has never had this issue before.  She does report some R shoulder pain.   Valerie Pearson has no symptoms of cervical myelopathy.   The symptoms are causing a significant impact on the patient's life.    Review of Systems:  A 10 point review of systems is negative, except for the pertinent positives and negatives detailed in the HPI.   Past Medical History:     Past Medical History:  Diagnosis Date   Anxiety     Depression     Hypothyroidism     Migraines     Uterine fibroid        Past Surgical History:      Past Surgical History:  Procedure Laterality Date   CARPAL TUNNEL RELEASE Left 06/08/2020    Procedure: CARPAL TUNNEL RELEASE ENDOSCOPIC;  Surgeon: Corky Mull, MD;  Location: [facility] ORS;  Service: Orthopedics;  Laterality: Left;   CHOLECYSTECTOMY   2006   COLONOSCOPY   2013, 2018   DILATION AND CURETTAGE OF UTERUS   2014   ESOPHAGOGASTRODUODENOSCOPY   2018   TUBAL LIGATION   2016      Allergies:      Allergies as of 07/26/2022 - Review Complete 07/26/2022  Allergen Reaction Noted   Iodinated contrast media Rash 10/12/2013      Medications: Active Medications   No outpatient medications have been marked as taking for the 07/26/22 encounter [facility] Encounter).        Social History: Social History        Tobacco Use   Smoking status: Never   Smokeless tobacco: Never  Vaping Use   Vaping Use: Never used  Substance Use Topics   Alcohol use: Not Currently   Drug use: Never      Family Medical History: Family History  Family history unknown: Yes      Physical Examination:     Vitals:    07/26/22 1542 07/26/22 1639  BP: 134/82 125/80  Pulse: 95 80  Resp: 15 18  Temp: 98.5 F (36.9 C) 98.3 F (36.8 C)  SpO2: 100% 99%      General:          Patient is well developed, well nourished, calm, collected, and in moderate distress. Attention to examination is appropriate.   Neck:               Supple.  Full range of motion with discomfort.   Respiratory:     Patient is breathing without any difficulty.     NEUROLOGICAL:     Awake, alert, oriented to person, place, and time.  Speech is clear and fluent. Fund of knowledge is appropriate.    Cranial Nerves: Pupils equal round and reactive to light.  Facial tone is symmetric.  Facial sensation is symmetric. Shoulder shrug is symmetric. Tongue protrusion is midline.  There is no pronator drift.   ROM of spine: full.     Strength: Side Biceps Triceps Deltoid Interossei Grip Wrist Ext. Wrist Flex.  R '5 5 5 5 5 5 5  '$ L '5 5 5 5 5 5 5    '$ Side Iliopsoas Quads Hamstring PF DF EHL  R '5 5 5 5 5 5  '$ L '5 5 5 5 5 5    '$ Reflexes are 1+ and symmetric at the biceps, triceps, brachioradialis, patella and achilles.   Hoffman's is absent.  Clonus is not present.  Toes are down-going.  Bilateral upper and lower extremity sensation is intact to light touch.    No evidence of dysmetria noted.   Gait is untested.     Medical Decision Making   Imaging: MRI C spine 07/26/22 IMPRESSION: Multilevel degenerative changes as detailed above. 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     Electronically Signed   By: Macy Mis M.D.   On: 07/26/2022 11:43   I have personally reviewed the images and agree with the above interpretation.   Assessment and Plan: [name] is a pleasant 41 y.o. female with probable R cervical radiculopathy. She has concordant MRI findings.  I have recommended that she begin physical therapy as an outpatient.  I will refer her for an injection.  If she does not improve with these interventions, we will consider C6-7 arthroplasty versus anterior cervical discectomy and fusion.  I will see her back in 6 to 8 weeks.  I spent a total of 15 minutes in face-to-face and non-face-to-face activities related to this patient's care today.   Thank you for involving me in the care of this patient.         Cassie Henkels K. Izora Ribas MD, [facility] Neurosurgery

## 2022-08-16 ENCOUNTER — Ambulatory Visit: Payer: Self-pay | Admitting: Neurosurgery

## 2022-10-04 ENCOUNTER — Ambulatory Visit (INDEPENDENT_AMBULATORY_CARE_PROVIDER_SITE_OTHER): Payer: BC Managed Care – PPO | Admitting: Neurosurgery

## 2022-10-04 ENCOUNTER — Encounter: Payer: Self-pay | Admitting: Neurosurgery

## 2022-10-04 VITALS — BP 115/71 | HR 86 | Ht 61.0 in | Wt 155.6 lb

## 2022-10-04 DIAGNOSIS — R29898 Other symptoms and signs involving the musculoskeletal system: Secondary | ICD-10-CM | POA: Diagnosis not present

## 2022-10-04 DIAGNOSIS — M5412 Radiculopathy, cervical region: Secondary | ICD-10-CM

## 2022-10-04 NOTE — H&P (View-Only) (Signed)
Referring Physician:  No referring provider defined for this encounter.   Primary Physician:  Sharyne Peach, MD   History of Present Illness:  10/04/2022 Valerie Pearson returns to see me.  She has done physical therapy but had worsening of her symptoms with physical therapy.  She continues to have significant discomfort in her right arm and feels that she has weakness in her right arm.   08/09/2022 Valerie Pearson is to have severe pain in her right arm.  It is changed a small amount, but continues to bother her.  She can only sleep in certain positions.  Steroids helped a small amount.  Gabapentin has taken the edge off.  She does not feel that the oxycodone has helped her very much.   07/26/2022 Valerie Pearson is here today with a chief complaint of R neck and arm pain.  She presented to the emergency department with approximately 24 hours of severe pain.  She reports pain into her R shoulder blade through her upper arm to her forearm and 2nd-4th fingers.  She denies weakness.  She has some discomfort in her R pectoral muscle.   She has never had this issue before.  She does report some R shoulder pain.   Valerie Pearson has no symptoms of cervical myelopathy.   The symptoms are causing a significant impact on the patient's life.    Review of Systems:  A 10 point review of systems is negative, except for the pertinent positives and negatives detailed in the HPI.   Past Medical History:     Past Medical History:  Diagnosis Date   Anxiety     Depression     Hypothyroidism     Migraines     Uterine fibroid        Past Surgical History:      Past Surgical History:  Procedure Laterality Date   CARPAL TUNNEL RELEASE Left 06/08/2020    Procedure: CARPAL TUNNEL RELEASE ENDOSCOPIC;  Surgeon: Corky Mull, MD;  Location: [facility] ORS;  Service: Orthopedics;  Laterality: Left;   CHOLECYSTECTOMY   2006   COLONOSCOPY   2013, 2018   DILATION AND CURETTAGE OF UTERUS   2014    ESOPHAGOGASTRODUODENOSCOPY   2018   TUBAL LIGATION   2016      Allergies:      Allergies as of 07/26/2022 - Review Complete 07/26/2022  Allergen Reaction Noted   Iodinated contrast media Rash 10/12/2013      Medications: Active Medications  No outpatient medications have been marked as taking for the 07/26/22 encounter [facility] Encounter).        Social History: Social History        Tobacco Use   Smoking status: Never   Smokeless tobacco: Never  Vaping Use   Vaping Use: Never used  Substance Use Topics   Alcohol use: Not Currently   Drug use: Never      Family Medical History: Family History  Family history unknown: Yes      Physical Examination:     Vitals:    07/26/22 1542 07/26/22 1639  BP: 134/82 125/80  Pulse: 95 80  Resp: 15 18  Temp: 98.5 F (36.9 C) 98.3 F (36.8 C)  SpO2: 100% 99%      General:          Patient is well developed, well nourished, calm, collected, and in moderate distress. Attention to examination is appropriate.   Neck:  Supple.  Full range of motion with discomfort.   Respiratory:     Patient is breathing without any difficulty.     NEUROLOGICAL:     Awake, alert, oriented to person, place, and time.  Speech is clear and fluent. Fund of knowledge is appropriate.    Cranial Nerves: Pupils equal round and reactive to light.  Facial tone is symmetric.  Facial sensation is symmetric. Shoulder shrug is symmetric. Tongue protrusion is midline.  There is no pronator drift.   ROM of spine: full.     Strength: Side Biceps Triceps Deltoid Interossei Grip Wrist Ext. Wrist Flex.  R 5 4- 5 4+ 5 4+ 5  L '5 5 5 5 5 5 5    '$ Side Iliopsoas Quads Hamstring PF DF EHL  R '5 5 5 5 5 5  '$ L '5 5 5 5 5 5    '$ Reflexes are 1+ and symmetric at the biceps, triceps, brachioradialis, patella and achilles.   Hoffman's is absent.  Clonus is not present.  Toes are down-going.  Bilateral upper and lower extremity sensation is intact to light  touch with exception of diminished right C7 distribution.    No evidence of dysmetria noted.   Gait is untested.     Medical Decision Making   Imaging: MRI C spine 07/26/22 IMPRESSION: Multilevel degenerative changes as detailed above. 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     Electronically Signed   By: Macy Mis M.D.   On: 07/26/2022 11:43   I have personally reviewed the images and agree with the above interpretation.   Assessment and Plan: Valerie Pearson is a pleasant 41 y.o. female with probable R cervical radiculopathy. She has concordant MRI findings.  Since I last saw her, she has developed significant right triceps weakness.  She tried physical therapy but this made her symptoms worse.  At this point, I have recommended that she discontinue conservative management given her development of weakness.  I recommended surgical intervention as no further conservative management is indicated.  I recommended C6-7 cervical disc arthroplasty.  I discussed the planned procedure at length with the patient, including the risks, benefits, alternatives, and indications. The risks discussed include but are not limited to bleeding, infection, need for reoperation, spinal fluid leak, stroke, vision loss, anesthetic complication, coma, paralysis, and even death. We also discussed the possibility of post-operative dysphagia, vocal cord paralysis, and the risk of adjacent segment disease in the future. I also described in detail that improvement was not guaranteed.  The patient expressed understanding of these risks, and asked that we proceed with surgery. I described the surgery in layman's terms, and gave ample opportunity for questions, which were answered to the best of my ability.  I spent a total of 15 minutes in face-to-face and non-face-to-face activities related to this patient's care today.   Thank you for involving me in the care of this  patient.         Adriene Knipfer K. Izora Ribas MD, [facility] Neurosurgery

## 2022-10-04 NOTE — Patient Instructions (Signed)
Please see below for information in regards to your upcoming surgery:  Planned surgery: C6-7 arthroplasty   Surgery date: 10/29/22 - you will find out your arrival time the business day before your surgery.   Pre-op appointment at [facility]: we will call you with a date/time for this. Pre-admit testing is located on the first floor of the [facility], [place], [address]. Please bring all prescriptions in the original prescription bottles to your appointment, even if you have reviewed medications by phone with a pharmacy representative. Pre-op labs may be done at your pre-op appointment. You are not required to fast for these labs. Should you need to change your pre-op appointment, please call Pre-admit testing at [phone].    Because you are having an arthroplasty: for appointments after your 2 week follow-up: please arrive at the [facility] outpatient imaging center ([place], [place]) or [facility] one hour prior to your appointment for x-rays. This applies to every appointment after your 2 week follow-up. Failure to do so may result in your appointment being rescheduled.   If you have FMLA/disability paperwork, please drop it off or fax it to [phone], attention [place].   If you have any questions/concerns before or after surgery, you can reach [place] at [phone], or you can send a mychart message. If you have a concern after hours that cannot wait until normal business hours, you can call [phone] and ask to page the neurosurgeon on call for [place].     Appointments/FMLA & disability paperwork: [place] Nurse: Ophelia Shoulder  Medical assistant: Raquel Sarna Physician Assistant's: Cooper Render & Geronimo Boot Surgeon: Meade Maw, MD

## 2022-10-04 NOTE — Progress Notes (Signed)
Referring Physician:  No referring provider defined for this encounter.   Primary Physician:  Sharyne Peach, MD   History of Present Illness:  10/04/2022 Valerie Pearson returns to see me.  She has done physical therapy but had worsening of her symptoms with physical therapy.  She continues to have significant discomfort in her right arm and feels that she has weakness in her right arm.   08/09/2022 Valerie Pearson is to have severe pain in her right arm.  It is changed a small amount, but continues to bother her.  She can only sleep in certain positions.  Steroids helped a small amount.  Gabapentin has taken Valerie edge off.  She does not feel that Valerie oxycodone has helped her very much.   07/26/2022 Valerie Pearson is here today with a chief complaint of R neck and arm pain.  She presented to Valerie emergency department with approximately 24 hours of severe pain.  She reports pain into her R shoulder blade through her upper arm to her forearm and 2nd-4th fingers.  She denies weakness.  She has some discomfort in her R pectoral muscle.   She has never had this issue before.  She does report some R shoulder pain.   Valerie Pearson has no symptoms of cervical myelopathy.   Valerie symptoms are causing a significant impact on Valerie Pearson's life.    Review of Systems:  A 10 point review of systems is negative, except for Valerie pertinent positives and negatives detailed in Valerie HPI.   Past Medical History:     Past Medical History:  Diagnosis Date   Anxiety     Depression     Hypothyroidism     Migraines     Uterine fibroid        Past Surgical History:      Past Surgical History:  Procedure Laterality Date   CARPAL TUNNEL RELEASE Left 06/08/2020    Procedure: CARPAL TUNNEL RELEASE ENDOSCOPIC;  Surgeon: Corky Mull, MD;  Location: [facility] ORS;  Service: Orthopedics;  Laterality: Left;   CHOLECYSTECTOMY   2006   COLONOSCOPY   2013, 2018   DILATION AND CURETTAGE OF UTERUS   2014    ESOPHAGOGASTRODUODENOSCOPY   2018   TUBAL LIGATION   2016      Allergies:      Allergies as of 07/26/2022 - Review Complete 07/26/2022  Allergen Reaction Noted   Iodinated contrast media Rash 10/12/2013      Medications: Active Medications  No outpatient medications have been marked as taking for Valerie 07/26/22 encounter [facility] Encounter).        Social History: Social History        Tobacco Use   Smoking status: Never   Smokeless tobacco: Never  Vaping Use   Vaping Use: Never used  Substance Use Topics   Alcohol use: Not Currently   Drug use: Never      Family Medical History: Family History  Family history unknown: Yes      Physical Examination:     Vitals:    07/26/22 1542 07/26/22 1639  BP: 134/82 125/80  Pulse: 95 80  Resp: 15 18  Temp: 98.5 F (36.9 C) 98.3 F (36.8 C)  SpO2: 100% 99%      General:          Pearson is well developed, well nourished, calm, collected, and in moderate distress. Attention to examination is appropriate.   Neck:  Supple.  Full range of motion with discomfort.   Respiratory:     Pearson is breathing without any difficulty.     NEUROLOGICAL:     Awake, alert, oriented to person, place, and time.  Speech is clear and fluent. Fund of knowledge is appropriate.    Cranial Nerves: Pupils equal round and reactive to light.  Facial tone is symmetric.  Facial sensation is symmetric. Shoulder shrug is symmetric. Tongue protrusion is midline.  There is no pronator drift.   ROM of spine: full.     Strength: Side Biceps Triceps Deltoid Interossei Grip Wrist Ext. Wrist Flex.  R 5 4- 5 4+ 5 4+ 5  L '5 5 5 5 5 5 5    '$ Side Iliopsoas Quads Hamstring PF DF EHL  R '5 5 5 5 5 5  '$ L '5 5 5 5 5 5    '$ Reflexes are 1+ and symmetric at Valerie biceps, triceps, brachioradialis, patella and achilles.   Hoffman's is absent.  Clonus is not present.  Toes are down-going.  Bilateral upper and lower extremity sensation is intact to light  touch with exception of diminished right C7 distribution.    No evidence of dysmetria noted.   Gait is untested.     Medical Decision Making   Imaging: MRI C spine 07/26/22 IMPRESSION: Multilevel degenerative changes as detailed above. 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     Electronically Signed   By: Macy Mis M.D.   On: 07/26/2022 11:43   I have personally reviewed Valerie images and agree with Valerie above interpretation.   Assessment and Plan: Valerie Pearson is a pleasant 41 y.o. female with probable R cervical radiculopathy. She has concordant MRI findings.  Since I last saw her, she has developed significant right triceps weakness.  She tried physical therapy but this made her symptoms worse.  At this point, I have recommended that she discontinue conservative management given her development of weakness.  I recommended surgical intervention as no further conservative management is indicated.  I recommended C6-7 cervical disc arthroplasty.  I discussed Valerie planned procedure at length with Valerie Pearson, including Valerie risks, benefits, alternatives, and indications. Valerie risks discussed include but are not limited to bleeding, infection, need for reoperation, spinal fluid leak, stroke, vision loss, anesthetic complication, coma, paralysis, and even death. We also discussed Valerie possibility of post-operative dysphagia, vocal cord paralysis, and Valerie risk of adjacent segment disease in Valerie future. I also described in detail that improvement was not guaranteed.  Valerie Pearson expressed understanding of these risks, and asked that we proceed with surgery. I described Valerie surgery in layman's terms, and gave ample opportunity for questions, which were answered to Valerie best of my ability.  I spent a total of 15 minutes in face-to-face and non-face-to-face activities related to this Pearson's care today.   Thank you for involving me in Valerie care of this  Pearson.         Lynnlee Revels K. Izora Ribas MD, [facility] Neurosurgery

## 2022-10-08 ENCOUNTER — Other Ambulatory Visit: Payer: Self-pay

## 2022-10-08 DIAGNOSIS — Z01818 Encounter for other preprocedural examination: Secondary | ICD-10-CM

## 2022-10-15 ENCOUNTER — Encounter
Admission: RE | Admit: 2022-10-15 | Discharge: 2022-10-15 | Disposition: A | Discharge status: Home / Self Care | Payer: BC Managed Care – PPO | Source: Ambulatory Visit | Attending: Neurosurgery | Admitting: Neurosurgery

## 2022-10-15 DIAGNOSIS — Z01812 Encounter for preprocedural laboratory examination: Secondary | ICD-10-CM | POA: Diagnosis not present

## 2022-10-15 HISTORY — DX: Prediabetes: R73.03

## 2022-10-15 HISTORY — DX: Obstructive sleep apnea (adult) (pediatric): G47.33

## 2022-10-15 HISTORY — DX: Radiculopathy, cervical region: M54.12

## 2022-10-15 LAB — TYPE AND SCREEN
ABO/RH(D): A POS
Antibody Screen: NEGATIVE

## 2022-10-15 LAB — SURGICAL PCR SCREEN
MRSA, PCR: NEGATIVE
Staphylococcus aureus: NEGATIVE

## 2022-10-15 NOTE — Patient Instructions (Addendum)
Your procedure is scheduled on: Monday, December 4 Report to the Registration Desk on the 1st floor of the [facility]. To find out your arrival time, please call [phone] between 1PM - 3PM on: Friday, December 1 If your arrival time is 6:00 am, do not arrive prior to that time as the [place] entrance doors do not open until 6:00 am.  REMEMBER: Instructions that are not followed completely may result in serious medical risk, up to and including death; or upon the discretion of your surgeon and anesthesiologist your surgery may need to be rescheduled.  Do not eat food after midnight the night before surgery.  No gum chewing, lozengers or hard candies.  You may however, drink CLEAR liquids up to 2 hours before you are scheduled to arrive for your surgery. Do not drink anything within 2 hours of your scheduled arrival time.  Clear liquids include: - water  - apple juice without pulp - gatorade (not RED colors) - black coffee or tea (Do NOT add milk or creamers to the coffee or tea) Do NOT drink anything that is not on this list.  DON NOT TAKE ANY MEDICATIONS THE MORNING OF SURGERY   One week prior to surgery: starting November 27 Stop Anti-inflammatories (NSAIDS) such as Advil, Aleve, Ibuprofen, Motrin, Naproxen, Naprosyn and Aspirin based products such as Excedrin, Goodys Powder, BC Powder. Stop ANY OVER THE COUNTER supplements until after surgery. You may however, continue to take Tylenol if needed for pain up until the day of surgery.  No Alcohol for 24 hours before or after surgery.  No Smoking including e-cigarettes for 24 hours prior to surgery.  No chewable tobacco products for at least 6 hours prior to surgery.  No nicotine patches on the day of surgery.  Do not use any "recreational" drugs for at least a week prior to your surgery.  Please be advised that the combination of cocaine and anesthesia may have negative outcomes, up to and including death. If you test  positive for cocaine, your surgery will be cancelled.  On the morning of surgery brush your teeth with toothpaste and water, you may rinse your mouth with mouthwash if you wish. Do not swallow any toothpaste or mouthwash.  Use CHG Soap as directed on instruction sheet.  Do not wear jewelry, make-up, hairpins, clips or nail polish.  Do not wear lotions, powders, or perfumes.   Do not shave body from the neck down 48 hours prior to surgery just in case you cut yourself which could leave a site for infection.  Also, freshly shaved skin may become irritated if using the CHG soap.  Do not bring valuables to the hospital. [facility] is not responsible for any missing/lost belongings or valuables.   Bring your C-PAP to the hospital with you in case you may have to spend the night.   Notify your doctor if there is any change in your medical condition (cold, fever, infection).  Wear comfortable clothing (specific to your surgery type) to the hospital.  After surgery, you can help prevent lung complications by doing breathing exercises.  Take deep breaths and cough every 1-2 hours. Your doctor may order a device called an Incentive Spirometer to help you take deep breaths.  If you are being discharged the day of surgery, you will not be allowed to drive home. You will need a responsible adult (18 years or older) to drive you home and stay with you that night.   If you are  taking public transportation, you will need to have a responsible adult (18 years or older) with you. Please confirm with your physician that it is acceptable to use public transportation.   Please call the [place] [facility] at [phone] if you have any questions about these instructions.  Surgery Visitation Policy:  Patients undergoing a surgery or procedure may have two family members or support persons with them as long as the person is not COVID-19 positive or experiencing its symptoms.      Preparing for Surgery with CHLORHEXIDINE GLUCONATE (CHG) Soap  Chlorhexidine Gluconate (CHG) Soap  o An antiseptic cleaner that kills germs and bonds with the skin to continue killing germs even after washing  o Used for showering the night before surgery and morning of surgery  Before surgery, you can play an important role by reducing the number of germs on your skin.  CHG (Chlorhexidine gluconate) soap is an antiseptic cleanser which kills germs and bonds with the skin to continue killing germs even after washing.  Please do not use if you have an allergy to CHG or antibacterial soaps. If your skin becomes reddened/irritated stop using the CHG.  1. Shower the NIGHT BEFORE SURGERY and the MORNING OF SURGERY with CHG soap.  2. If you choose to wash your hair, wash your hair first as usual with your normal shampoo.  3. After shampooing, rinse your hair and body thoroughly to remove the shampoo.  4. Use CHG as you would any other liquid soap. You can apply CHG directly to the skin and wash gently with a scrungie or a clean washcloth.  5. Apply the CHG soap to your body only from the neck down. Do not use on open wounds or open sores. Avoid contact with your eyes, ears, mouth, and genitals (private parts). Wash face and genitals (private parts) with your normal soap.  6. Wash thoroughly, paying special attention to the area where your surgery will be performed.  7. Thoroughly rinse your body with warm water.  8. Do not shower/wash with your normal soap after using and rinsing off the CHG soap.  9. Pat yourself dry with a clean towel.  10. Wear clean pajamas to bed the night before surgery.  12. Place clean sheets on your bed the night of your first shower and do not sleep with pets.  13. Shower again with the CHG soap on the day of surgery prior to arriving at the hospital.  14. Do not apply any deodorants/lotions/powders.  15. Please wear clean clothes to the hospital.

## 2022-10-29 ENCOUNTER — Encounter
Admission: RE | Disposition: A | Discharge status: Home / Self Care | Payer: Self-pay | Source: Ambulatory Visit | Attending: Neurosurgery

## 2022-10-29 ENCOUNTER — Ambulatory Visit: Payer: BC Managed Care – PPO

## 2022-10-29 ENCOUNTER — Ambulatory Visit: Payer: BC Managed Care – PPO | Admitting: Anesthesiology

## 2022-10-29 ENCOUNTER — Other Ambulatory Visit: Payer: Self-pay

## 2022-10-29 ENCOUNTER — Ambulatory Visit
Admission: RE | Admit: 2022-10-29 | Discharge: 2022-10-29 | Disposition: A | Discharge status: Home / Self Care | Payer: BC Managed Care – PPO | Source: Ambulatory Visit | Attending: Neurosurgery | Admitting: Neurosurgery

## 2022-10-29 ENCOUNTER — Encounter: Payer: Self-pay | Admitting: Neurosurgery

## 2022-10-29 DIAGNOSIS — R531 Weakness: Secondary | ICD-10-CM | POA: Insufficient documentation

## 2022-10-29 DIAGNOSIS — Z01812 Encounter for preprocedural laboratory examination: Secondary | ICD-10-CM

## 2022-10-29 DIAGNOSIS — Z01818 Encounter for other preprocedural examination: Secondary | ICD-10-CM

## 2022-10-29 DIAGNOSIS — F32A Depression, unspecified: Secondary | ICD-10-CM | POA: Insufficient documentation

## 2022-10-29 DIAGNOSIS — R29898 Other symptoms and signs involving the musculoskeletal system: Secondary | ICD-10-CM | POA: Diagnosis not present

## 2022-10-29 DIAGNOSIS — R7303 Prediabetes: Secondary | ICD-10-CM | POA: Diagnosis not present

## 2022-10-29 DIAGNOSIS — G4733 Obstructive sleep apnea (adult) (pediatric): Secondary | ICD-10-CM | POA: Insufficient documentation

## 2022-10-29 DIAGNOSIS — G43909 Migraine, unspecified, not intractable, without status migrainosus: Secondary | ICD-10-CM | POA: Diagnosis not present

## 2022-10-29 DIAGNOSIS — M2578 Osteophyte, vertebrae: Secondary | ICD-10-CM | POA: Diagnosis not present

## 2022-10-29 DIAGNOSIS — F419 Anxiety disorder, unspecified: Secondary | ICD-10-CM | POA: Insufficient documentation

## 2022-10-29 DIAGNOSIS — E0590 Thyrotoxicosis, unspecified without thyrotoxic crisis or storm: Secondary | ICD-10-CM | POA: Insufficient documentation

## 2022-10-29 DIAGNOSIS — M50123 Cervical disc disorder at C6-C7 level with radiculopathy: Secondary | ICD-10-CM | POA: Diagnosis not present

## 2022-10-29 DIAGNOSIS — M5412 Radiculopathy, cervical region: Secondary | ICD-10-CM | POA: Diagnosis present

## 2022-10-29 HISTORY — PX: CERVICAL DISC ARTHROPLASTY: SHX587

## 2022-10-29 LAB — POCT PREGNANCY, URINE: Preg Test, Ur: NEGATIVE

## 2022-10-29 LAB — ABO/RH: ABO/RH(D): A POS

## 2022-10-29 SURGERY — CERVICAL ANTERIOR DISC ARTHROPLASTY
Anesthesia: General | Site: Spine Cervical

## 2022-10-29 MED ORDER — PROPOFOL 10 MG/ML IV BOLUS
INTRAVENOUS | Status: AC
  Filled 2022-10-29: qty 20

## 2022-10-29 MED ORDER — LACTATED RINGERS IV SOLN: INTRAVENOUS | Status: DC

## 2022-10-29 MED ORDER — PHENYLEPHRINE HCL (PRESSORS) 10 MG/ML IV SOLN
INTRAVENOUS | Status: AC
  Filled 2022-10-29: qty 1

## 2022-10-29 MED ORDER — SURGIFLO WITH THROMBIN (HEMOSTATIC MATRIX KIT) OPTIME
TOPICAL | Status: DC | PRN
  Administered 2022-10-29: 1 via TOPICAL

## 2022-10-29 MED ORDER — DEXMEDETOMIDINE HCL IN NACL 80 MCG/20ML IV SOLN
INTRAVENOUS | Status: DC | PRN
  Administered 2022-10-29: 8 ug via INTRAVENOUS

## 2022-10-29 MED ORDER — FAMOTIDINE 20 MG PO TABS: 20.0000 mg | ORAL_TABLET | Freq: Once | ORAL | Status: AC

## 2022-10-29 MED ORDER — DEXAMETHASONE SODIUM PHOSPHATE 10 MG/ML IJ SOLN
INTRAMUSCULAR | Status: DC | PRN
  Administered 2022-10-29: 10 mg via INTRAVENOUS

## 2022-10-29 MED ORDER — SUCCINYLCHOLINE CHLORIDE 200 MG/10ML IV SOSY
PREFILLED_SYRINGE | INTRAVENOUS | Status: DC | PRN
  Administered 2022-10-29: 100 mg via INTRAVENOUS

## 2022-10-29 MED ORDER — PROPOFOL 10 MG/ML IV BOLUS
INTRAVENOUS | Status: DC | PRN
  Administered 2022-10-29: 200 mg via INTRAVENOUS

## 2022-10-29 MED ORDER — OXYCODONE HCL 5 MG PO TABS
5.0000 mg | ORAL_TABLET | Freq: Once | ORAL | Status: AC | PRN
  Administered 2022-10-29: 5 mg via ORAL

## 2022-10-29 MED ORDER — PROMETHAZINE HCL 25 MG/ML IJ SOLN: 6.2500 mg | INTRAMUSCULAR | Status: DC | PRN

## 2022-10-29 MED ORDER — GLYCOPYRROLATE 0.2 MG/ML IJ SOLN
INTRAMUSCULAR | Status: AC
  Filled 2022-10-29: qty 1

## 2022-10-29 MED ORDER — MIDAZOLAM HCL 2 MG/2ML IJ SOLN
INTRAMUSCULAR | Status: DC | PRN
  Administered 2022-10-29: 2 mg via INTRAVENOUS

## 2022-10-29 MED ORDER — CEFAZOLIN IN SODIUM CHLORIDE 2-0.9 GM/100ML-% IV SOLN
2.0000 g | Freq: Once | INTRAVENOUS | Status: DC
  Filled 2022-10-29: qty 100

## 2022-10-29 MED ORDER — ONDANSETRON HCL 4 MG/2ML IJ SOLN
INTRAMUSCULAR | Status: DC | PRN
  Administered 2022-10-29: 4 mg via INTRAVENOUS

## 2022-10-29 MED ORDER — 0.9 % SODIUM CHLORIDE (POUR BTL) OPTIME
TOPICAL | Status: DC | PRN
  Administered 2022-10-29: 1000 mL

## 2022-10-29 MED ORDER — METHOCARBAMOL 500 MG PO TABS
500.0000 mg | ORAL_TABLET | Freq: Once | ORAL | Status: AC
  Administered 2022-10-29: 500 mg via ORAL

## 2022-10-29 MED ORDER — GLYCOPYRROLATE 0.2 MG/ML IJ SOLN
INTRAMUSCULAR | Status: DC | PRN
  Administered 2022-10-29: .2 mg via INTRAVENOUS

## 2022-10-29 MED ORDER — ACETAMINOPHEN 10 MG/ML IV SOLN: 1000.0000 mg | Freq: Once | INTRAVENOUS | Status: DC | PRN

## 2022-10-29 MED ORDER — FAMOTIDINE 20 MG PO TABS
ORAL_TABLET | ORAL | Status: AC
  Administered 2022-10-29: 20 mg via ORAL
  Filled 2022-10-29: qty 1

## 2022-10-29 MED ORDER — METHOCARBAMOL 500 MG PO TABS: 500.0000 mg | ORAL_TABLET | Freq: Four times a day (QID) | ORAL | 0 refills | Status: DC | PRN

## 2022-10-29 MED ORDER — ONDANSETRON HCL 4 MG/2ML IJ SOLN
INTRAMUSCULAR | Status: AC
  Filled 2022-10-29: qty 2

## 2022-10-29 MED ORDER — MIDAZOLAM HCL 2 MG/2ML IJ SOLN
INTRAMUSCULAR | Status: AC
  Filled 2022-10-29: qty 2

## 2022-10-29 MED ORDER — CEFAZOLIN SODIUM-DEXTROSE 2-4 GM/100ML-% IV SOLN
2.0000 g | INTRAVENOUS | Status: AC
  Administered 2022-10-29: 2 g via INTRAVENOUS

## 2022-10-29 MED ORDER — DROPERIDOL 2.5 MG/ML IJ SOLN: 0.6250 mg | Freq: Once | INTRAMUSCULAR | Status: DC | PRN

## 2022-10-29 MED ORDER — FENTANYL CITRATE (PF) 100 MCG/2ML IJ SOLN
INTRAMUSCULAR | Status: AC
  Filled 2022-10-29: qty 2

## 2022-10-29 MED ORDER — SEVOFLURANE IN SOLN
RESPIRATORY_TRACT | Status: AC
  Filled 2022-10-29: qty 250

## 2022-10-29 MED ORDER — FENTANYL CITRATE (PF) 100 MCG/2ML IJ SOLN
25.0000 ug | INTRAMUSCULAR | Status: DC | PRN
  Administered 2022-10-29: 50 ug via INTRAVENOUS

## 2022-10-29 MED ORDER — NAPROXEN 500 MG PO TABS: 500.0000 mg | ORAL_TABLET | Freq: Two times a day (BID) | ORAL | 0 refills | Status: DC

## 2022-10-29 MED ORDER — CHLORHEXIDINE GLUCONATE 0.12 % MT SOLN: 15.0000 mL | Freq: Once | OROMUCOSAL | Status: AC

## 2022-10-29 MED ORDER — BUPIVACAINE-EPINEPHRINE (PF) 0.5% -1:200000 IJ SOLN
INTRAMUSCULAR | Status: DC | PRN
  Administered 2022-10-29: 8 mL

## 2022-10-29 MED ORDER — PHENYLEPHRINE 80 MCG/ML (10ML) SYRINGE FOR IV PUSH (FOR BLOOD PRESSURE SUPPORT)
PREFILLED_SYRINGE | INTRAVENOUS | Status: AC
  Filled 2022-10-29: qty 10

## 2022-10-29 MED ORDER — SUCCINYLCHOLINE CHLORIDE 200 MG/10ML IV SOSY
PREFILLED_SYRINGE | INTRAVENOUS | Status: AC
  Filled 2022-10-29: qty 10

## 2022-10-29 MED ORDER — METHOCARBAMOL 500 MG PO TABS
ORAL_TABLET | ORAL | Status: AC
  Filled 2022-10-29: qty 1

## 2022-10-29 MED ORDER — OXYCODONE HCL 5 MG PO TABS: 5.0000 mg | ORAL_TABLET | ORAL | 0 refills | Status: AC | PRN

## 2022-10-29 MED ORDER — ORAL CARE MOUTH RINSE: 15.0000 mL | Freq: Once | OROMUCOSAL | Status: AC

## 2022-10-29 MED ORDER — CEFAZOLIN SODIUM-DEXTROSE 2-4 GM/100ML-% IV SOLN
INTRAVENOUS | Status: AC
  Filled 2022-10-29: qty 100

## 2022-10-29 MED ORDER — CHLORHEXIDINE GLUCONATE 0.12 % MT SOLN
OROMUCOSAL | Status: AC
  Administered 2022-10-29: 15 mL via OROMUCOSAL
  Filled 2022-10-29: qty 15

## 2022-10-29 MED ORDER — FENTANYL CITRATE (PF) 100 MCG/2ML IJ SOLN
INTRAMUSCULAR | Status: AC
  Administered 2022-10-29: 50 ug via INTRAVENOUS
  Filled 2022-10-29: qty 2

## 2022-10-29 MED ORDER — PHENYLEPHRINE HCL (PRESSORS) 10 MG/ML IV SOLN
INTRAVENOUS | Status: DC | PRN
  Administered 2022-10-29 (×4): 50 ug via INTRAVENOUS
  Administered 2022-10-29: 80 ug via INTRAVENOUS
  Administered 2022-10-29: 50 ug via INTRAVENOUS
  Administered 2022-10-29: 40 ug via INTRAVENOUS
  Administered 2022-10-29 (×4): 50 ug via INTRAVENOUS
  Administered 2022-10-29: 100 ug via INTRAVENOUS
  Administered 2022-10-29: 80 ug via INTRAVENOUS
  Administered 2022-10-29: 40 ug via INTRAVENOUS
  Administered 2022-10-29: 50 ug via INTRAVENOUS
  Administered 2022-10-29 (×2): 40 ug via INTRAVENOUS
  Administered 2022-10-29: 50 ug via INTRAVENOUS

## 2022-10-29 MED ORDER — DEXAMETHASONE SODIUM PHOSPHATE 10 MG/ML IJ SOLN
INTRAMUSCULAR | Status: AC
  Filled 2022-10-29: qty 1

## 2022-10-29 MED ORDER — FENTANYL CITRATE (PF) 100 MCG/2ML IJ SOLN
INTRAMUSCULAR | Status: DC | PRN
  Administered 2022-10-29 (×2): 100 ug via INTRAVENOUS

## 2022-10-29 MED ORDER — BUPIVACAINE-EPINEPHRINE (PF) 0.5% -1:200000 IJ SOLN
INTRAMUSCULAR | Status: AC
  Filled 2022-10-29: qty 30

## 2022-10-29 MED ORDER — OXYCODONE HCL 5 MG PO TABS
ORAL_TABLET | ORAL | Status: AC
  Filled 2022-10-29: qty 1

## 2022-10-29 MED ORDER — OXYCODONE HCL 5 MG/5ML PO SOLN: 5.0000 mg | Freq: Once | ORAL | Status: AC | PRN

## 2022-10-29 SURGICAL SUPPLY — 54 items
ADH SKN CLS APL DERMABOND .7 (GAUZE/BANDAGES/DRESSINGS) ×1
AGENT HMST KT MTR STRL THRMB (HEMOSTASIS) ×1
APL PRP STRL LF DISP 70% ISPRP (MISCELLANEOUS) ×2
BUR NEURO DRILL SOFT 3.0X3.8M (BURR) ×1 IMPLANT
CHLORAPREP W/TINT 26 (MISCELLANEOUS) ×2 IMPLANT
COUNTER NEEDLE 20/40 LG (NEEDLE) ×1 IMPLANT
DERMABOND ADVANCED .7 DNX12 (GAUZE/BANDAGES/DRESSINGS) ×1 IMPLANT
DISC MOBI-C CERVICAL 13X17 H6 (Miscellaneous) IMPLANT
DRAPE C ARM PK CFD 31 SPINE (DRAPES) ×1 IMPLANT
DRAPE LAPAROTOMY 77X122 PED (DRAPES) ×1 IMPLANT
DRAPE MICROSCOPE SPINE 48X150 (DRAPES) ×1 IMPLANT
DRAPE SURG 17X11 SM STRL (DRAPES) ×1 IMPLANT
ELECT CAUTERY BLADE TIP 2.5 (TIP) ×1
ELECT REM PT RETURN 9FT ADLT (ELECTROSURGICAL) ×1
ELECTRODE CAUTERY BLDE TIP 2.5 (TIP) ×1 IMPLANT
ELECTRODE REM PT RTRN 9FT ADLT (ELECTROSURGICAL) ×1 IMPLANT
FEE INTRAOP CADWELL SUPPLY NCS (MISCELLANEOUS) IMPLANT
FEE INTRAOP MONITOR IMPULS NCS (MISCELLANEOUS) IMPLANT
GLOVE BIOGEL PI IND STRL 6.5 (GLOVE) ×1 IMPLANT
GLOVE BIOGEL PI IND STRL 8.5 (GLOVE) ×1 IMPLANT
GLOVE SURG SYN 6.5 ES PF (GLOVE) ×1 IMPLANT
GLOVE SURG SYN 6.5 PF PI (GLOVE) ×1 IMPLANT
GLOVE SURG SYN 8.5  E (GLOVE) ×3
GLOVE SURG SYN 8.5 E (GLOVE) ×3 IMPLANT
GLOVE SURG SYN 8.5 PF PI (GLOVE) ×3 IMPLANT
GOWN SRG LRG LVL 4 IMPRV REINF (GOWNS) ×1 IMPLANT
GOWN SRG XL LVL 3 NONREINFORCE (GOWNS) ×1 IMPLANT
GOWN STRL NON-REIN TWL XL LVL3 (GOWNS) ×1
GOWN STRL REIN LRG LVL4 (GOWNS) ×1
GRADUATE 1200CC STRL 31836 (MISCELLANEOUS) ×1 IMPLANT
INTRAOP CADWELL SUPPLY FEE NCS (MISCELLANEOUS)
INTRAOP DISP SUPPLY FEE NCS (MISCELLANEOUS)
INTRAOP MONITOR FEE IMPULS NCS (MISCELLANEOUS)
INTRAOP MONITOR FEE IMPULSE (MISCELLANEOUS)
KIT TURNOVER KIT A (KITS) ×1 IMPLANT
MANIFOLD NEPTUNE II (INSTRUMENTS) ×1 IMPLANT
MARKER SKIN DUAL TIP RULER LAB (MISCELLANEOUS) ×2 IMPLANT
NDL SAFETY ECLIP 18X1.5 (MISCELLANEOUS) IMPLANT
NS IRRIG 1000ML POUR BTL (IV SOLUTION) ×1 IMPLANT
PACK LAMINECTOMY NEURO (CUSTOM PROCEDURE TRAY) ×1 IMPLANT
PAD ARMBOARD 7.5X6 YLW CONV (MISCELLANEOUS) ×1 IMPLANT
PIN CASPAR SPINAL 12MM (PIN) IMPLANT
SOLUTION IRRIG SURGIPHOR (IV SOLUTION) ×1 IMPLANT
SPONGE KITTNER 5P (MISCELLANEOUS) ×1 IMPLANT
STAPLER SKIN PROX 35W (STAPLE) IMPLANT
SURGIFLO W/THROMBIN 8M KIT (HEMOSTASIS) ×1 IMPLANT
SUT DVC VLOC 3-0 CL 6 P-12 (SUTURE) IMPLANT
SUT VIC AB 3-0 SH 8-18 (SUTURE) ×1 IMPLANT
SYR 30ML LL (SYRINGE) ×1 IMPLANT
TAPE CLOTH 3X10 WHT NS LF (GAUZE/BANDAGES/DRESSINGS) ×1 IMPLANT
TOWEL OR 17X26 4PK STRL BLUE (TOWEL DISPOSABLE) ×3 IMPLANT
TRAP FLUID SMOKE EVACUATOR (MISCELLANEOUS) ×1 IMPLANT
TRAY FOLEY MTR SLVR 16FR STAT (SET/KITS/TRAYS/PACK) IMPLANT
TUBING CONNECTING 10 (TUBING) ×1 IMPLANT

## 2022-10-29 NOTE — Discharge Summary (Signed)
Discharge Summary  Patient ID: Valerie Pearson MRN: [id number] DOB/AGE: 05-10-1981 41 y.o.  Admit date: 10/29/2022 Discharge date: 10/29/2022  Admission Diagnoses: M54.12 cervical radiculopathy R29.898 right arm weakness  Discharge Diagnoses:  Active Problems:   * No active hospital problems. *   Discharged Condition: good  Hospital Course:  Valerie Pearson is a 41 y.o presenting with right sided cervical radiculopathy and right arm weakness s/p C6-7 athroplasty. Her intraoperative course was uncomplicated. She was monitored in PACU for 4 hours and discharged home after ambulating, urinating, and tolerating PO intake. She was given prescriptions for Oxycodone, Robaxin, and Naproxen.   Consults: None  Significant Diagnostic Studies: none   Treatments: surgery: as above. Please see separately dictated operative report for further details   Discharge Exam: Blood pressure 116/82, pulse 89, temperature (!) 97.2 F (36.2 C), temperature source Temporal, resp. rate 16, height '5\' 1"'$  (1.549 m), weight 71.7 kg, last menstrual period 09/14/2022, SpO2 100 %. CN II-XII grossly intact 5/5 throughout except 4- in right triceps 4+ IO, WE and WF on the right Incision c/d/I with dermabond in place  Disposition: Discharge disposition: 01-Home or Self Care       Discharge Instructions     No wound care   Complete by: As directed       Allergies as of 10/29/2022       Reactions   Iodinated Contrast Media Rash        Medication List     STOP taking these medications    acetaminophen 500 MG tablet Commonly known as: TYLENOL   celecoxib 200 MG capsule Commonly known as: CELEBREX       TAKE these medications    Gemtesa 75 MG Tabs Generic drug: Vibegron Take 1 tablet by mouth at bedtime.   levothyroxine 25 MCG tablet Commonly known as: SYNTHROID Take 25 mcg by mouth at bedtime.   methocarbamol 500 MG tablet Commonly known as: ROBAXIN Take 1 tablet (500 mg  total) by mouth every 6 (six) hours as needed for muscle spasms.   naproxen 500 MG tablet Commonly known as: Naprosyn Take 1 tablet (500 mg total) by mouth 2 (two) times daily with a meal.   oxyCODONE 5 MG immediate release tablet Commonly known as: Roxicodone Take 1 tablet (5 mg total) by mouth every 4 (four) hours as needed for up to 5 days for severe pain.   SUMAtriptan 25 MG tablet Commonly known as: IMITREX Take 25 mg by mouth every 2 (two) hours as needed for migraine. May repeat in 2 hours if headache persists or recurs.         Signed: Loleta Pearson 10/29/2022, 1:21 PM

## 2022-10-29 NOTE — Discharge Instructions (Addendum)
Your surgeon has performed an operation on your cervical spine (neck) to relieve pressure on the spinal cord and/or nerves. This involved making an incision in the front of your neck and removing one or more of the discs that support your spine. Next, a small piece of bone, a titanium plate, and screws were used to fuse two or more of the vertebrae (bones) together.  The following are instructions to help in your recovery once you have been discharged from the hospital. Even if you feel well, it is important that you follow these activity guidelines. If you do not let your neck heal properly from the surgery, you can increase the chance of return of your symptoms and other complications.  Activity    No bending, lifting, or twisting ("BLT"). Avoid lifting objects heavier than 10 pounds (gallon milk jug).  Where possible, avoid household activities that involve lifting, bending, reaching, pushing, or pulling such as laundry, vacuuming, grocery shopping, and childcare. Try to arrange for help from friends and family for these activities while your back heals.  Increase physical activity slowly as tolerated.  Taking short walks is encouraged, but avoid strenuous exercise. Do not jog, run, bicycle, lift weights, or participate in any other exercises unless specifically allowed by your doctor.  Talk to your doctor before resuming sexual activity.  You should not drive until cleared by your doctor.  Until released by your doctor, you should not return to work or school.  You should rest at home and let your body heal.   You may shower three days after your surgery.  After showering, lightly dab your incision dry. Do not take a tub bath or go swimming until approved by your doctor at your follow-up appointment.  If your doctor ordered a cervical collar (neck brace) for you, you should wear it whenever you are out of bed. You may remove it when lying down or sleeping, but you should wear it at all other  times. Not all neck surgeries require a cervical collar.  If you smoke, we strongly recommend that you quit.  Smoking has been proven to interfere with normal bone healing and will dramatically reduce the success rate of your surgery. Please contact [facility] (800-QUIT-NOW) and use the resources at www.[facility].com for assistance in stopping smoking.  Surgical Incision   If you have a dressing on your incision, you may remove it two days after your surgery. Keep your incision area clean and dry.  If you have staples or stitches on your incision, you should have a follow up scheduled for removal. If you do not have staples or stitches, you will have steri-strips (small pieces of surgical tape) or Dermabond glue. The steri-strips/glue should begin to peel away within about a week (it is fine if the steri-strips fall off before then). If the strips are still in place one week after your surgery, you may gently remove them.  Diet           You may return to your usual diet. However, you may experience discomfort when swallowing in the first month after your surgery. This is normal. You may find that softer foods are more comfortable for you to swallow. Be sure to stay hydrated.  When to Contact us  You may experience pain in your neck and/or pain between your shoulder blades. This is normal and should improve in the next few weeks with the help of pain medication, muscle relaxers, and rest. Some patients report that a warm compress  on the back of the neck or between the shoulder blades helps.  However, should you experience any of the following, contact us immediately: New numbness or weakness Pain that is progressively getting worse, and is not relieved by your pain medication, muscle relaxers, rest, and warm compresses Bleeding, redness, swelling, pain, or drainage from surgical incision Chills or flu-like symptoms Fever greater than 101.0 F (38.3 C) Inability to eat, drink fluids, or take  medications Problems with bowel or bladder functions Difficulty breathing or shortness of breath Warmth, tenderness, or swelling in your calf Contact Information During office hours (Monday-Friday 9 am to 5 pm), please call your physician at [phone] and ask for Valerie Pearson After hours and weekends, please call [phone] and speak with the neurosurgeon on call For a life-threatening emergency, call 911  Your surgeon has performed an operation on your cervical spine (neck) to relieve pressure on the spinal cord and/or nerves. This involved making an incision in the front of your neck and removing one or more of the discs that support your spine. Next, a small piece of bone, a titanium plate, and screws were used to fuse two or more of the vertebrae (bones) together.  The following are instructions to help in your recovery once you have been discharged from the hospital. Even if you feel well, it is important that you follow these activity guidelines. If you do not let your neck heal properly from the surgery, you can increase the chance of return of your symptoms and other complications.  * Do not take anti-inflammatory medications for 3 months after surgery (naproxen [Aleve], ibuprofen [Advil, Motrin], etc.). These medications can prevent your bones from healing properly.  Celebrex, if prescribed, is ok to take.  Activity    No bending, lifting, or twisting ("BLT"). Avoid lifting objects heavier than 10 pounds (gallon milk jug).  Where possible, avoid household activities that involve lifting, bending, reaching, pushing, or pulling such as laundry, vacuuming, grocery shopping, and childcare. Try to arrange for help from friends and family for these activities while your back heals.  Increase physical activity slowly as tolerated.  Taking short walks is encouraged, but avoid strenuous exercise. Do not jog, run, bicycle, lift weights, or participate in any other exercises unless  specifically allowed by your doctor.  Talk to your doctor before resuming sexual activity.  You should not drive until cleared by your doctor.  Until released by your doctor, you should not return to work or school.  You should rest at home and let your body heal.   You may shower three days after your surgery.  After showering, lightly dab your incision dry. Do not take a tub bath or go swimming until approved by your doctor at your follow-up appointment.  If your doctor ordered a cervical collar (neck brace) for you, you should wear it whenever you are out of bed. You may remove it when lying down or sleeping, but you should wear it at all other times. Not all neck surgeries require a cervical collar.  If you smoke, we strongly recommend that you quit.  Smoking has been proven to interfere with normal bone healing and will dramatically reduce the success rate of your surgery. Please contact [facility] (800-QUIT-NOW) and use the resources at www.[facility]com for assistance in stopping smoking.  Surgical Incision   If you have a dressing on your incision, you may remove it two days after your surgery. Keep your incision area clean and dry.  If you have  staples or stitches on your incision, you should have a follow up scheduled for removal. If you do not have staples or stitches, you will have steri-strips (small pieces of surgical tape) or Dermabond glue. The steri-strips/glue should begin to peel away within about a week (it is fine if the steri-strips fall off before then). If the strips are still in place one week after your surgery, you may gently remove them.  Diet           You may return to your usual diet. However, you may experience discomfort when swallowing in the first month after your surgery. This is normal. You may find that softer foods are more comfortable for you to swallow. Be sure to stay hydrated.  When to Contact us  You may experience pain in your neck and/or pain  between your shoulder blades. This is normal and should improve in the next few weeks with the help of pain medication, muscle relaxers, and rest. Some patients report that a warm compress on the back of the neck or between the shoulder blades helps.  However, should you experience any of the following, contact us immediately: New numbness or weakness Pain that is progressively getting worse, and is not relieved by your pain medication, muscle relaxers, rest, and warm compresses Bleeding, redness, swelling, pain, or drainage from surgical incision Chills or flu-like symptoms Fever greater than 101.0 F (38.3 C) Inability to eat, drink fluids, or take medications Problems with bowel or bladder functions Difficulty breathing or shortness of breath Warmth, tenderness, or swelling in your calf Contact Information During office hours (Monday-Friday 9 am to 5 pm), please call your physician at [phone] and ask for Valerie Pearson After hours and weekends, please call [phone] and speak with the neurosurgeon on call For a life-threatening emergency, call [place]   The drugs that you were given will stay in your system until tomorrow so for the next 24 hours you should not:  Drive an automobile Make any legal decisions Drink any alcoholic beverage   You may resume regular meals tomorrow.  Today it is better to start with liquids and gradually work up to solid foods.  You may eat anything you prefer, but it is better to start with liquids, then soup and crackers, and gradually work up to solid foods.   Please notify your doctor immediately if you have any unusual bleeding, trouble breathing, redness and pain at the surgery site, drainage, fever, or pain not relieved by medication.    Additional Instructions:        Please contact your physician with any problems or Same Day Surgery at [phone], Monday through Friday 6 am to 4 pm,  or [place] at [facility] number at [phone].

## 2022-10-29 NOTE — Transfer of Care (Signed)
Immediate Anesthesia Transfer of Care Note  [name]  Procedure(s) Performed: C6-7 ARTHROPLASTY (Spine Cervical)  Patient Location: PACU  Anesthesia Type:General  Level of Consciousness: awake, alert , oriented, and patient cooperative  Airway & Oxygen Therapy: Patient Spontanous Breathing and Patient connected to face mask oxygen  Post-op Assessment: Report given to RN and Post -op Vital signs reviewed and stable  Post vital signs: Reviewed and stable  Last Vitals:  Vitals Value Taken Time  BP 107/79 10/29/22 1330  Temp    Pulse 110 10/29/22 1333  Resp 22 10/29/22 1333  SpO2 100 % 10/29/22 1333  Vitals shown include unvalidated device data.  Last Pain:  Vitals:   10/29/22 1058  TempSrc: Temporal  PainSc: 3          Complications: No notable events documented.

## 2022-10-29 NOTE — Interval H&P Note (Signed)
History and Physical Interval Note:  10/29/2022 11:10 AM  Valerie Pearson  has presented today for surgery, with the diagnosis of M54.12 cervical radiculopathy R29.898 right arm weakness.  The various methods of treatment have been discussed with the patient and family. After consideration of risks, benefits and other options for treatment, the patient has consented to  Procedure(s): C6-7 ARTHROPLASTY (N/A) as a surgical intervention.  The patient's history has been reviewed, patient examined, no change in status, stable for surgery.  I have reviewed the patient's chart and labs.  Questions were answered to the patient's satisfaction.    Heart sounds normal no MRG. Chest Clear to Auscultation Bilaterally.  Demetre Monaco

## 2022-10-29 NOTE — Anesthesia Procedure Notes (Signed)
Procedure Name: Intubation Date/Time: 10/29/2022 12:01 PM  Performed by: Rolla Plate, CRNAPre-anesthesia Checklist: Patient identified, Patient being monitored, Timeout performed, Emergency Drugs available and Suction available Patient Re-evaluated:Patient Re-evaluated prior to induction Oxygen Delivery Method: Circle system utilized Preoxygenation: Pre-oxygenation with 100% oxygen Induction Type: IV induction Ventilation: Mask ventilation without difficulty Laryngoscope Size: 3 and McGraph Grade View: Grade II Tube type: Oral Tube size: 7.0 mm Number of attempts: 1 Airway Equipment and Method: Stylet and Video-laryngoscopy Placement Confirmation: ETT inserted through vocal cords under direct vision, positive ETCO2 and breath sounds checked- equal and bilateral Secured at: 20 cm Tube secured with: Tape Dental Injury: Teeth and Oropharynx as per pre-operative assessment

## 2022-10-29 NOTE — Progress Notes (Signed)
Patient meets all discharge requirements, is able to ambulate without difficulty, void, eat and drink without difficulty.

## 2022-10-29 NOTE — Anesthesia Preprocedure Evaluation (Addendum)
Anesthesia Evaluation  Patient identified by MRN, date of birth, ID band Patient awake    Reviewed: Allergy & Precautions, NPO status , Patient's Chart, lab work & pertinent test results  Airway Mallampati: III  TM Distance: >3 FB Neck ROM: full    Dental no notable dental hx.    Pulmonary sleep apnea    Pulmonary exam normal        Cardiovascular Exercise Tolerance: Good negative cardio ROS  Rhythm:Regular Rate:Normal + Systolic murmurs- Peripheral Edema    Neuro/Psych  PSYCHIATRIC DISORDERS Anxiety Depression     Neuromuscular disease    GI/Hepatic negative GI ROS, Neg liver ROS,,,  Endo/Other  pre-diabetes Hyperthyroidism  Renal/GU      Musculoskeletal   Abdominal Normal abdominal exam  (+)   Peds  Hematology negative hematology ROS (+)   Anesthesia Other Findings right arm weakness  Past Medical History: No date: Anxiety No date: Cervical radiculopathy No date: Depression No date: Hypothyroidism No date: Migraines 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 No date: Pre-diabetes No date: Uterine fibroid  Past Surgical History: 06/08/2020: CARPAL TUNNEL RELEASE; Left     Comment:  Procedure: CARPAL TUNNEL RELEASE ENDOSCOPIC;  Surgeon:               Corky Mull, MD;  Location: [facility] ORS;  Service:               Orthopedics;  Laterality: Left; 2006: CHOLECYSTECTOMY 2013, 2018: COLONOSCOPY 2014: DILATION AND CURETTAGE OF UTERUS 2018: ESOPHAGOGASTRODUODENOSCOPY 07/23/2022: ESOPHAGOGASTRODUODENOSCOPY 2016: TUBAL LIGATION     Reproductive/Obstetrics negative OB ROS                             Anesthesia Physical Anesthesia Plan  ASA: 2  Anesthesia Plan: General ETT   Post-op Pain Management: Ofirmev IV (intra-op)* and Toradol IV (intra-op)*   Induction: Intravenous  PONV Risk Score and Plan: 3 and Ondansetron, Dexamethasone and Midazolam  Airway Management Planned:  Oral ETT  Additional Equipment:   Intra-op Plan:   Post-operative Plan: Extubation in OR  Informed Consent: I have reviewed the patients History and Physical, chart, labs and discussed the procedure including the risks, benefits and alternatives for the proposed anesthesia with the patient or authorized representative who has indicated his/her understanding and acceptance.     Dental Advisory Given  Plan Discussed with: Anesthesiologist, CRNA and Surgeon  Anesthesia Plan Comments:         Anesthesia Quick Evaluation

## 2022-10-29 NOTE — Op Note (Signed)
Indications: Ms. Verbeke presented with M54.12 cervical radiculopathy, R29.898 right arm weakness . Due to worsening symptoms, surgery was recommended  Findings: disc herniation C6-7  Preoperative Diagnosis: M54.12 cervical radiculopathy, R29.898 right arm weakness  Postoperative Diagnosis: same   EBL: 25 ml IVF: see AR ml Drains: none Disposition: Extubated and Stable to PACU Complications: none  No foley catheter was placed.   Preoperative Note:   Risks of surgery discussed include: infection, bleeding, stroke, coma, death, paralysis, CSF leak, nerve/spinal cord injury, numbness, tingling, weakness, complex regional pain syndrome, recurrent stenosis and/or disc herniation, vascular injury, development of instability, neck/back pain, need for further surgery, persistent symptoms, development of deformity, and the risks of anesthesia. The patient understood these risks and agreed to proceed.  Operative Note:  Procedure:  1) Cervical Disc Arthroplasty at C6/7 using a LDR Mobi-C device   Procedure: After obtaining informed consent, the patient taken to the operating room, placed in supine position, general anesthesia induced.  The patient had a small shoulder roll placed behind the neck.  The patient received preop antibiotics and IV Decadron.  The patient had a neck incision outlined, was prepped and draped in usual sterile fashion. The incision was injected with local anesthetic.   An incision was opened, dissection taken down medial to the carotid artery and jugular vein, lateral to the trachea and esophagus.  The prevertebral fascia identified and a localizing x-ray demonstrated the correct level.  The longus colli were dissected laterally, and self-retaining retractors placed to open the operative field. The microscope was then brought into the field.  With this complete, distractor pins were placed in the vertebral bodies of C6 and C7. The distractor was placed, and the anulus at C6/7  was opened using a bovie.  Curettes and pituitary rongeurs used to remove the majority of disk, then the drill was used to remove the posterior osteophyte and begin the foraminotomies. The nerve hook was used to elevate the posterior longitudinal ligament, which was then removed with Kerrison rongeurs. The microblunt nerve hook could be passed out the foramen bilaterally.   Meticulous hemostasis was obtained.  A trial spacer was used to size the disc space. Using flouroscopic guidance, a 17 mm width x 13 mm depth x 6 mm height Mobi-C was then inserted in the prepared disc space.  The caspar distractor was removed, and bone wax used for hemostasis. Final AP and lateral radiographs were taken.   With the disc arthroplasty in good position, the wound was irrigated copiously and meticulous hemostasis obtained.  Wound was closed in 2 layers using interrupted inverted 3-0 Vicryl sutures.  The wound was dressed with dermabond, the head of bed at 30 degrees, taken to recovery room in stable condition.  No new postop neurological deficits were identified.  Sponge and pattie counts were correct at the end of the procedure.     I performed the entire procedure with the assistance of Cooper Render PA as an Pensions consultant. An assistant was required for this procedure due to the complexity.  The assistant provided assistance in tissue manipulation and suction, and was required for the successful and safe performance of the procedure. I performed the critical portions of the procedure.   Meade Maw MD

## 2022-10-30 ENCOUNTER — Encounter: Payer: Self-pay | Admitting: Neurosurgery

## 2022-10-30 NOTE — Anesthesia Postprocedure Evaluation (Signed)
Anesthesia Post Note  [name]  Procedure(s) Performed: C6-7 ARTHROPLASTY (Spine Cervical)  Patient location during evaluation: PACU Anesthesia Type: General Level of consciousness: awake and alert Pain management: pain level controlled Vital Signs Assessment: post-procedure vital signs reviewed and stable Respiratory status: spontaneous breathing, nonlabored ventilation and respiratory function stable Cardiovascular status: blood pressure returned to baseline and stable Postop Assessment: no apparent nausea or vomiting Anesthetic complications: no   No notable events documented.   Last Vitals:  Vitals:   10/29/22 1600 10/29/22 1637  BP: (!) 125/91   Pulse: 92   Resp: 18   Temp:  36.4 C  SpO2: 98%     Last Pain:  Vitals:   10/29/22 1637  TempSrc: Temporal  PainSc: 2                  [place] Ouch

## 2022-11-05 ENCOUNTER — Telehealth: Payer: Self-pay

## 2022-11-05 DIAGNOSIS — Z9889 Other specified postprocedural states: Secondary | ICD-10-CM

## 2022-11-05 DIAGNOSIS — M5412 Radiculopathy, cervical region: Secondary | ICD-10-CM

## 2022-11-05 MED ORDER — TIZANIDINE HCL 4 MG PO TABS: 4.0000 mg | ORAL_TABLET | Freq: Three times a day (TID) | ORAL | 0 refills | Status: DC | PRN

## 2022-11-05 NOTE — Telephone Encounter (Signed)
-----   Message from Peggyann Shoals sent at 11/05/2022  9:48 AM EST ----- Regarding: postop issues Contact: [phone] C6-7 arthroplasty on 10/29/22 She is having bilateral shoulder pain that increased yesterday. Is this a side effect to surgery? She feels methocarbamol is causing her to have joint pain especially in her hips. What can she do about that?

## 2022-11-05 NOTE — Telephone Encounter (Signed)
Patient notified of new medication and side effects.

## 2022-11-05 NOTE — Telephone Encounter (Signed)
Will try zanaflex. I sent to pharmacy. Let her know to take as needed and be careful, this can make her sleepy.

## 2022-11-05 NOTE — Telephone Encounter (Signed)
I spoke with the patient. She is complaining of pain in her shoulders and shoulder blades. She is taking naproxen routinely. She feels the methocarbamol is causing joint pain. She stopping taking the methocarbamol and this has improved. She would like to try a different muscle relaxer.  [place]

## 2022-11-12 NOTE — Progress Notes (Unsigned)
   REFERRING PHYSICIAN:  Sharyne Peach, Md [place],  [place] [address]  DOS: 10/29/22 C6-C7 arthroplasty  HISTORY OF PRESENT ILLNESS: Valerie Pearson is 2 weeks status post C6-C7 arthroplasty. Given robaxin and oxycodone on discharge from the hospital.   She called stating robaxin was causing joint pain- zanaflex was called into her pharmacy.   Her preop right arm pain is gone. She thinks weakness is improving. She has some neck pain and pain in between her shoulder blades. She notes some burning and numbness above the incision. She has patch of eczema lateral to incision- she thinks she is allergic to the dermabond.   She needs a refill of naproxen- she was taking it more than prescribed after surgery. She has oxycodone to take prn.    PHYSICAL EXAMINATION:  NEUROLOGICAL:  General: In no acute distress.   Awake, alert, oriented to person, place, and time.  Pupils equal round and reactive to light.  Facial tone is symmetric.    Strength: Side Biceps Triceps Deltoid Interossei Grip Wrist Ext. Wrist Flex.  R '5 5 5 5 5 5 5  '$ L '5 5 5 5 5 5 5   '$ Incision c/d/I, small patch of dry scaly skin lateral to incision- nothing over the incision. She states this looks like her chronic eczema.   Imaging:  Nothing new to review.   Assessment / Plan: [name] is doing well s/p above surgery. Treatment options reviewed with patient and following plan made:   - We discussed activity escalation and I have advised the patient to lift up to 10 pounds until 6 weeks after surgery.    - Reviewed wound care.  - Continue current medications including prn oxycodone and prn zanaflex.  - Continue prn naproxen. Advised not to take more than prescribed. Reviewed dosing and side effects. Take with food. Refill given.  - Area of sensitivity above incision (has burning/numbness) should improve.  - Follow up as scheduled in 4 weeks and prn.   Advised to contact the office if any  questions or concerns arise.   Geronimo Boot PA-C Dept of Neurosurgery

## 2022-11-13 ENCOUNTER — Encounter: Payer: Self-pay | Admitting: Orthopedic Surgery

## 2022-11-13 ENCOUNTER — Ambulatory Visit (INDEPENDENT_AMBULATORY_CARE_PROVIDER_SITE_OTHER): Payer: BC Managed Care – PPO | Admitting: Orthopedic Surgery

## 2022-11-13 VITALS — BP 122/77 | HR 85 | Temp 98.5°F

## 2022-11-13 DIAGNOSIS — Z9889 Other specified postprocedural states: Secondary | ICD-10-CM

## 2022-11-13 DIAGNOSIS — M5412 Radiculopathy, cervical region: Secondary | ICD-10-CM

## 2022-11-13 DIAGNOSIS — Z09 Encounter for follow-up examination after completed treatment for conditions other than malignant neoplasm: Secondary | ICD-10-CM

## 2022-11-13 MED ORDER — NAPROXEN 500 MG PO TABS: 500.0000 mg | ORAL_TABLET | Freq: Two times a day (BID) | ORAL | 0 refills | Status: DC

## 2022-12-12 ENCOUNTER — Other Ambulatory Visit: Payer: Self-pay

## 2022-12-12 DIAGNOSIS — Z9889 Other specified postprocedural states: Secondary | ICD-10-CM

## 2022-12-13 ENCOUNTER — Ambulatory Visit (INDEPENDENT_AMBULATORY_CARE_PROVIDER_SITE_OTHER): Payer: BC Managed Care – PPO | Admitting: Neurosurgery

## 2022-12-13 ENCOUNTER — Encounter: Payer: Self-pay | Admitting: Neurosurgery

## 2022-12-13 ENCOUNTER — Ambulatory Visit
Admission: RE | Admit: 2022-12-13 | Discharge: 2022-12-13 | Disposition: A | Discharge status: Home / Self Care | Payer: BC Managed Care – PPO | Source: Ambulatory Visit | Attending: Neurosurgery | Admitting: Neurosurgery

## 2022-12-13 ENCOUNTER — Ambulatory Visit
Admission: RE | Admit: 2022-12-13 | Discharge: 2022-12-13 | Disposition: A | Discharge status: Home / Self Care | Payer: BC Managed Care – PPO | Attending: Neurosurgery | Admitting: Neurosurgery

## 2022-12-13 VITALS — BP 118/70 | Ht 61.0 in | Wt 158.0 lb

## 2022-12-13 DIAGNOSIS — Z09 Encounter for follow-up examination after completed treatment for conditions other than malignant neoplasm: Secondary | ICD-10-CM

## 2022-12-13 DIAGNOSIS — Z9889 Other specified postprocedural states: Secondary | ICD-10-CM

## 2022-12-13 DIAGNOSIS — M5412 Radiculopathy, cervical region: Secondary | ICD-10-CM

## 2022-12-13 NOTE — Progress Notes (Signed)
   REFERRING PHYSICIAN:  Sharyne Peach, Md [place],  [place] [address]  DOS: 10/29/22 C6-C7 arthroplasty  HISTORY OF PRESENT ILLNESS: Valerie Pearson is status post C6-C7 arthroplasty.  She is doing well.  She still has some neck pain, but her arm pain is much improved.  She is very happy with her improvement in her arm pain.      PHYSICAL EXAMINATION:  NEUROLOGICAL:  General: In no acute distress.   Awake, alert, oriented to person, place, and time.  Pupils equal round and reactive to light.  Facial tone is symmetric.    Strength: Side Biceps Triceps Deltoid Interossei Grip Wrist Ext. Wrist Flex.  R '5 5 5 5 5 5 5  '$ L '5 5 5 5 5 5 5   '$ Incision c/d/I, small patch of dry scaly skin lateral to incision- nothing over the incision. She states this looks like her chronic eczema.   Imaging:  No complications noted   Assessment / Plan: Valerie Pearson is doing well s/p above surgery.  I have given her exercises for her neck.  I think her neck pain will continue to improve.  I am very pleased with her response to her arm pain.  I will see her back in 6 weeks with x-rays.  We reviewed her activity limitations.  She will go back to work in approximately 2 weeks.    Meade Maw Dept of Neurosurgery

## 2023-01-21 ENCOUNTER — Other Ambulatory Visit: Payer: Self-pay

## 2023-01-21 DIAGNOSIS — Z9889 Other specified postprocedural states: Secondary | ICD-10-CM

## 2023-01-22 ENCOUNTER — Ambulatory Visit (INDEPENDENT_AMBULATORY_CARE_PROVIDER_SITE_OTHER): Payer: BC Managed Care – PPO | Admitting: Neurosurgery

## 2023-01-22 ENCOUNTER — Ambulatory Visit
Admission: RE | Admit: 2023-01-22 | Discharge: 2023-01-22 | Disposition: A | Discharge status: Home / Self Care | Payer: BC Managed Care – PPO | Attending: Neurosurgery | Admitting: Neurosurgery

## 2023-01-22 ENCOUNTER — Ambulatory Visit
Admission: RE | Admit: 2023-01-22 | Discharge: 2023-01-22 | Disposition: A | Discharge status: Home / Self Care | Payer: BC Managed Care – PPO | Source: Ambulatory Visit | Attending: Neurosurgery | Admitting: Neurosurgery

## 2023-01-22 ENCOUNTER — Encounter: Payer: Self-pay | Admitting: Neurosurgery

## 2023-01-22 VITALS — Temp 98.3°F | Wt 157.8 lb

## 2023-01-22 DIAGNOSIS — Z9889 Other specified postprocedural states: Secondary | ICD-10-CM

## 2023-01-22 DIAGNOSIS — G8929 Other chronic pain: Secondary | ICD-10-CM

## 2023-01-22 DIAGNOSIS — Z09 Encounter for follow-up examination after completed treatment for conditions other than malignant neoplasm: Secondary | ICD-10-CM

## 2023-01-22 DIAGNOSIS — M5412 Radiculopathy, cervical region: Secondary | ICD-10-CM

## 2023-01-22 DIAGNOSIS — M546 Pain in thoracic spine: Secondary | ICD-10-CM

## 2023-01-22 NOTE — Progress Notes (Signed)
   REFERRING PHYSICIAN:  Sharyne Peach, [address] [place],  [place] [address]  DOS: 10/29/22 C6-C7 arthroplasty  HISTORY OF PRESENT ILLNESS: Valerie Pearson is status post C6-C7 arthroplasty.  She is doing well.    She is having some pain around her bra line.   PHYSICAL EXAMINATION:  NEUROLOGICAL:  General: In no acute distress.   Awake, alert, oriented to person, place, and time.  Pupils equal round and reactive to light.  Facial tone is symmetric.    Strength: Side Biceps Triceps Deltoid Interossei Grip Wrist Ext. Wrist Flex.  R 5 5 5 5 5 5 5  $ L 5 5 5 5 5 5 5   $ Incision c/d/I, small patch of dry scaly skin lateral to incision- nothing over the incision. She states this looks like her chronic eczema.   Imaging:  No complications noted   Assessment / Plan: Valerie Pearson is doing well s/p above surgery.    I will send her to the chiropractor for her thoracic back pain.  I am very pleased with her response to surgery.  I will see her back in 6 months.   Meade Maw Dept of Neurosurgery

## 2023-04-17 ENCOUNTER — Ambulatory Visit
Admission: EM | Admit: 2023-04-17 | Discharge: 2023-04-17 | Disposition: A | Discharge status: Home / Self Care | Payer: BC Managed Care – PPO | Attending: Emergency Medicine | Admitting: Emergency Medicine

## 2023-04-17 DIAGNOSIS — J069 Acute upper respiratory infection, unspecified: Secondary | ICD-10-CM

## 2023-04-17 LAB — POCT RAPID STREP A (OFFICE): Rapid Strep A Screen: NEGATIVE

## 2023-04-17 MED ORDER — BENZONATATE 100 MG PO CAPS: 100.0000 mg | ORAL_CAPSULE | Freq: Three times a day (TID) | ORAL | 0 refills | Status: DC | PRN

## 2023-04-17 NOTE — ED Provider Notes (Signed)
Renaldo Fiddler    CSN: [id number] Arrival date & time: 04/17/23  0857      History   Chief Complaint Chief Complaint  Patient presents with   Cough    HPI Valerie Pearson is a 42 y.o. female.  Patient presents with 1-2 day history of chills, body aches, headache, congestion, sore throat, hoarse voice, cough.  Treating with Sudafed and Mucinex; none taken today.  No fever, shortness of breath, vomiting, diarrhea, or other symptoms.  Her medical history includes hypothyroidism, hypertriglyceridemia, hyperglycemia, vitamin D deficiency, iron deficiency anemia, mood disorder.  The history is provided by the patient and medical records.    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   Impaired fasting glucose 07/27/2022   Cervical radiculopathy 07/26/2022   Hypothyroidism    Depression     Past Surgical History:  Procedure Laterality Date   CARPAL TUNNEL RELEASE Left 06/08/2020   Procedure: CARPAL TUNNEL RELEASE ENDOSCOPIC;  Surgeon: Christena Flake, MD;  Location: [facility] ORS;  Service: Orthopedics;  Laterality: Left;   CERVICAL DISC ARTHROPLASTY N/A 10/29/2022   Procedure: C6-7 ARTHROPLASTY;  Surgeon: Venetia Night, MD;  Location: [facility] ORS;  Service: Neurosurgery;  Laterality: N/A;   CHOLECYSTECTOMY  2006   COLONOSCOPY  2013, 2018   DILATION AND CURETTAGE OF UTERUS  2014   ESOPHAGOGASTRODUODENOSCOPY  2018   ESOPHAGOGASTRODUODENOSCOPY  07/23/2022   TUBAL LIGATION  2016    OB History   No obstetric history on file.      Home Medications    Prior to Admission medications   Medication Sig Start Date End Date Taking? Authorizing Provider  benzonatate (TESSALON) 100 MG capsule  Take 1 capsule (100 mg total) by mouth 3 (three) times daily as needed for cough. 04/17/23  Yes Mickie Bail, NP  celecoxib (CELEBREX) 200 MG capsule Take 1 capsule by mouth 2 (two) times daily. 01/14/23   [provider]  nortriptyline (PAMELOR) 10 MG capsule Take 10 mg by mouth at bedtime. 01/14/23   [provider]  SUMAtriptan (IMITREX) 25 MG tablet Take 25 mg by mouth every 2 (two) hours as needed. 12/19/22 12/19/23  [provider]    Family History Family History  Family history unknown: Yes    Social History Social History   Tobacco Use   Smoking status: Never   Smokeless tobacco: Never  Vaping Use   Vaping Use: Never used  Substance Use Topics   Alcohol use: Yes    Comment: rarely wine   Drug use: Never     Allergies   Iodinated contrast media, Methocarbamol, and Other   Review of Systems Review of Systems  Constitutional:  Positive for chills. Negative for fever.  HENT:  Positive for congestion, ear pain, postnasal drip, rhinorrhea, sore throat and voice change.   Respiratory:  Positive for cough. Negative for shortness of breath.   Cardiovascular:  Negative for chest pain and palpitations.  Gastrointestinal:  Negative for diarrhea and vomiting.  Neurological:  Positive for headaches.     Physical Exam Triage Vital Signs ED Triage Vitals  Enc Vitals Group     BP 04/17/23  1610 113/74     Pulse Rate 04/17/23 0946 92     Resp 04/17/23 0946 18     Temp 04/17/23 0946 98.9 F (37.2 C)     Temp src --      SpO2 04/17/23 0946 97 %     Weight --      Height --      Head Circumference --      Peak Flow --      Pain Score 04/17/23 0949 7     Pain Loc --      Pain Edu? --      Excl. in GC? --    No data found.  Updated Vital Signs BP 113/74 (BP Location: Left Arm)   Pulse 92   Temp 98.9 F (37.2 C)   Resp 18   LMP 04/14/2023 (Approximate)   SpO2 97%   Visual Acuity Right Eye Distance:   Left Eye Distance:   Bilateral  Distance:    Right Eye Near:   Left Eye Near:    Bilateral Near:     Physical Exam Vitals and nursing note reviewed.  Constitutional:      General: She is not in acute distress.    Appearance: Normal appearance. She is well-developed. She is not ill-appearing.  HENT:     Right Ear: Tympanic membrane normal.     Left Ear: Tympanic membrane normal.     Nose: Congestion present.     Mouth/Throat:     Mouth: Mucous membranes are moist.     Pharynx: Oropharynx is clear.     Comments: Clear PND. Cardiovascular:     Rate and Rhythm: Normal rate and regular rhythm.     Heart sounds: Normal heart sounds.  Pulmonary:     Effort: Pulmonary effort is normal. No respiratory distress.     Breath sounds: Normal breath sounds.  Musculoskeletal:     Cervical back: Neck supple.  Skin:    General: Skin is warm and dry.  Neurological:     Mental Status: She is alert.  Psychiatric:        Mood and Affect: Mood normal.        Behavior: Behavior normal.      UC Treatments / Results  Labs (all labs ordered are listed, but only abnormal results are displayed) Labs Reviewed  POCT RAPID STREP A (OFFICE)    EKG   Radiology No results found.  Procedures Procedures (including critical care time)  Medications Ordered in UC Medications - No data to display  Initial Impression / Assessment and Plan / UC Course  I have reviewed the triage vital signs and the nursing notes.  Pertinent labs & imaging results that were available during my care of the patient were reviewed by me and considered in my medical decision making (see chart for details).    Viral URI.  Afebrile, VSS.  Strep negative.  Discussed symptomatic treatment including Tessalon Perles, Tylenol, rest, hydration.  Instructed patient to follow up with her PCP if symptoms are not improving.  She agrees to plan of care.   Final Clinical Impressions(s) / UC Diagnoses   Final diagnoses:  Viral URI     Discharge  Instructions      The strep test is negative.      Take Tylenol as needed for fever or discomfort.  Take the Tessalon as directed for cough.  Rest and keep yourself hydrated.    Follow-up with your primary care provider if your  symptoms are not improving.         ED Prescriptions     Medication Sig Dispense Auth. Provider   benzonatate (TESSALON) 100 MG capsule Take 1 capsule (100 mg total) by mouth 3 (three) times daily as needed for cough. 21 capsule Mickie Bail, NP      PDMP not reviewed this encounter.   Mickie Bail, NP 04/17/23 1011

## 2023-04-17 NOTE — Discharge Instructions (Addendum)
The strep test is negative.      Take Tylenol as needed for fever or discomfort.  Take the Tessalon as directed for cough.  Rest and keep yourself hydrated.    Follow-up with your primary care provider if your symptoms are not improving.

## 2023-04-17 NOTE — ED Triage Notes (Signed)
Monday night productive cough and sore throat and Tuesday started with HA, stuffy nose, voice loss, sinus/chest congestion, body aches, and chills.  Home Remedies: Mucinex and Sudafed

## 2023-04-25 ENCOUNTER — Encounter: Payer: Self-pay | Admitting: *Deleted

## 2023-07-13 ENCOUNTER — Emergency Department: Payer: BC Managed Care – PPO

## 2023-07-13 ENCOUNTER — Other Ambulatory Visit: Payer: Self-pay

## 2023-07-13 ENCOUNTER — Emergency Department
Admission: EM | Admit: 2023-07-13 | Discharge: 2023-07-13 | Disposition: A | Discharge status: Home / Self Care | Payer: BC Managed Care – PPO | Attending: Emergency Medicine | Admitting: Emergency Medicine

## 2023-07-13 DIAGNOSIS — M545 Low back pain, unspecified: Secondary | ICD-10-CM | POA: Diagnosis present

## 2023-07-13 DIAGNOSIS — N938 Other specified abnormal uterine and vaginal bleeding: Secondary | ICD-10-CM

## 2023-07-13 DIAGNOSIS — R1032 Left lower quadrant pain: Secondary | ICD-10-CM | POA: Diagnosis not present

## 2023-07-13 DIAGNOSIS — E039 Hypothyroidism, unspecified: Secondary | ICD-10-CM | POA: Diagnosis not present

## 2023-07-13 LAB — URINALYSIS, ROUTINE W REFLEX MICROSCOPIC
Bacteria, UA: NONE SEEN
Bilirubin Urine: NEGATIVE
Glucose, UA: NEGATIVE mg/dL
Ketones, ur: NEGATIVE mg/dL
Leukocytes,Ua: NEGATIVE
Nitrite: NEGATIVE
Protein, ur: NEGATIVE mg/dL
Specific Gravity, Urine: 1.026 (ref 1.005–1.030)
pH: 5 (ref 5.0–8.0)

## 2023-07-13 LAB — CBC WITH DIFFERENTIAL/PLATELET
Abs Immature Granulocytes: 0.02 10*3/uL (ref 0.00–0.07)
Basophils Absolute: 0 10*3/uL (ref 0.0–0.1)
Basophils Relative: 0 %
Eosinophils Absolute: 0 10*3/uL (ref 0.0–0.5)
Eosinophils Relative: 0 %
HCT: 33.7 % — ABNORMAL LOW (ref 36.0–46.0)
Hemoglobin: 11.1 g/dL — ABNORMAL LOW (ref 12.0–15.0)
Immature Granulocytes: 0 %
Lymphocytes Relative: 35 %
Lymphs Abs: 2.5 10*3/uL (ref 0.7–4.0)
MCH: 27.1 pg (ref 26.0–34.0)
MCHC: 32.9 g/dL (ref 30.0–36.0)
MCV: 82.4 fL (ref 80.0–100.0)
Monocytes Absolute: 0.4 10*3/uL (ref 0.1–1.0)
Monocytes Relative: 6 %
Neutro Abs: 4.1 10*3/uL (ref 1.7–7.7)
Neutrophils Relative %: 59 %
Platelets: 308 10*3/uL (ref 150–400)
RBC: 4.09 MIL/uL (ref 3.87–5.11)
RDW: 13.7 % (ref 11.5–15.5)
WBC: 7.1 10*3/uL (ref 4.0–10.5)
nRBC: 0 % (ref 0.0–0.2)

## 2023-07-13 LAB — BASIC METABOLIC PANEL
Anion gap: 8 (ref 5–15)
BUN: 21 mg/dL — ABNORMAL HIGH (ref 6–20)
CO2: 24 mmol/L (ref 22–32)
Calcium: 8.5 mg/dL — ABNORMAL LOW (ref 8.9–10.3)
Chloride: 103 mmol/L (ref 98–111)
Creatinine, Ser: 0.81 mg/dL (ref 0.44–1.00)
GFR, Estimated: >60 mL/min (ref >60)
Glucose, Bld: 135 mg/dL — ABNORMAL HIGH (ref 70–99)
Potassium: 3.6 mmol/L (ref 3.5–5.1)
Sodium: 135 mmol/L (ref 135–145)

## 2023-07-13 LAB — PREGNANCY, URINE: Preg Test, Ur: NEGATIVE

## 2023-07-13 LAB — TSH: TSH: 0.65 u[IU]/mL (ref 0.350–4.500)

## 2023-07-13 MED ORDER — LIDOCAINE 5 % EX PTCH
1.0000 | MEDICATED_PATCH | Freq: Once | CUTANEOUS | Status: DC
  Administered 2023-07-13: 1 via TRANSDERMAL
  Filled 2023-07-13: qty 1

## 2023-07-13 MED ORDER — LIDOCAINE 5 % EX PTCH: 1.0000 | MEDICATED_PATCH | Freq: Two times a day (BID) | CUTANEOUS | 0 refills | Status: DC

## 2023-07-13 MED ORDER — KETOROLAC TROMETHAMINE 30 MG/ML IJ SOLN
15.0000 mg | Freq: Once | INTRAMUSCULAR | Status: AC
  Administered 2023-07-13: 15 mg via INTRAVENOUS
  Filled 2023-07-13: qty 1

## 2023-07-13 MED ORDER — BACLOFEN 10 MG PO TABS: 10.0000 mg | ORAL_TABLET | Freq: Three times a day (TID) | ORAL | 0 refills | Status: AC

## 2023-07-13 MED ORDER — METHYLPREDNISOLONE 4 MG PO TBPK: ORAL_TABLET | ORAL | 0 refills | Status: DC

## 2023-07-13 NOTE — ED Triage Notes (Signed)
Pt is on day 5 of period and c/o lower back pain x2 days, and states today her right flank hurts. Pt is AOX4, appears slightly uncomfortable. Pt unsure if blood in urine due to period bleeding.

## 2023-07-13 NOTE — ED Provider Notes (Signed)
[facility] Provider Note    Event Date/Time   First MD Initiated Contact with Patient 07/13/23 1240     (approximate)   History   Flank Pain   HPI  Valerie Pearson is a 42 y.o. female with history of hypothyroidism, uterine fibroids, migraines presents to the emergency department with vaginal bleeding for 5 weeks.  Now is developed lower back pain.  Has seen her GYN doctor and she gave her medication stop the bleeding but she continues to bleed.  Back pain started 2 days ago.  Denies fever, chills, no vomiting or diarrhea.  No burning with urination      Physical Exam   Triage Vital Signs: ED Triage Vitals [07/13/23 1241]  Encounter Vitals Group     BP 130/80     Systolic BP Percentile      Diastolic BP Percentile      Pulse Rate 82     Resp 18     Temp 98.5 F (36.9 C)     Temp Source Oral     SpO2 100 %     Weight      Height      Head Circumference      Peak Flow      Pain Score 7     Pain Loc      Pain Education      Exclude from Growth Chart     Most recent vital signs: Vitals:   07/13/23 1241  BP: 130/80  Pulse: 82  Resp: 18  Temp: 98.5 F (36.9 C)  SpO2: 100%     General: Awake, no distress.   CV:  Good peripheral perfusion. regular rate and  rhythm Resp:  Normal effort. Lungs cta Abd:  No distention.  Tender in the left lower quadrant Other:  Lumbar spine tender to palpation, no rash or bruising noted   ED Results / Procedures / Treatments   Labs (all labs ordered are listed, but only abnormal results are displayed) Labs Reviewed  BASIC METABOLIC PANEL - Abnormal; Notable for the following components:      Result Value   Glucose, Bld 135 (*)    BUN 21 (*)    Calcium 8.5 (*)    All other components within normal limits  CBC WITH DIFFERENTIAL/PLATELET - Abnormal; Notable for the following components:   Hemoglobin 11.1 (*)    HCT 33.7 (*)    All other components within normal limits  URINALYSIS, ROUTINE W  REFLEX MICROSCOPIC - Abnormal; Notable for the following components:   Color, Urine YELLOW (*)    APPearance HAZY (*)    Hgb urine dipstick LARGE (*)    All other components within normal limits  PREGNANCY, URINE  TSH     EKG     RADIOLOGY Ultrasound of the pelvis complete    PROCEDURES:   Procedures   MEDICATIONS ORDERED IN ED: Medications  lidocaine (LIDODERM) 5 % 1 patch (has no administration in time range)  ketorolac (TORADOL) 30 MG/ML injection 15 mg (15 mg Intravenous Given 07/13/23 1315)     IMPRESSION / MDM / ASSESSMENT AND PLAN / ED COURSE  I reviewed the triage vital signs and the nursing notes.                              Differential diagnosis includes, but is not limited to, dysfunctional uterine bleeding, hemorrhage, uterine fibroid, lumbar radiculopathy, hypothyroidism  Patient's  presentation is most consistent with acute illness / injury with system symptoms.   Labs, ultrasound of the pelvis, nursing staff instructed start IV   Labs are reassuring, patient was given Toradol 15 mg IV for pain, Lidoderm patch applied for pain  Ultrasound of the pelvis due to abnormal uterine bleeding independently reviewed interpreted by me as being positive for a fibroid.  X-ray of the lumbar spine does not show any acute abnormality.  I did explain the findings to patient.  She is to follow-up with her regular doctor.  She is given a Lidoderm patches, Medrol Dosepak, and baclofen.  Return emergency department if worsening.  She is in agreement treatment plan.  Discharged stable condition.   FINAL CLINICAL IMPRESSION(S) / ED DIAGNOSES   Final diagnoses:  Acute midline low back pain without sciatica  Dysfunctional uterine bleeding     Rx / DC Orders   ED Discharge Orders          Ordered    methylPREDNISolone (MEDROL DOSEPAK) 4 MG TBPK tablet        07/13/23 1527    baclofen (LIORESAL) 10 MG tablet  3 times daily        07/13/23 1527    lidocaine  (LIDODERM) 5 %  Every 12 hours        07/13/23 1527             Note:  This document was prepared using Dragon voice recognition software and may include unintentional dictation errors.    Faythe Ghee, PA-C 07/13/23 1529    Jene Every, MD 07/13/23 1537

## 2023-08-02 ENCOUNTER — Other Ambulatory Visit: Payer: Self-pay

## 2023-08-02 DIAGNOSIS — M5412 Radiculopathy, cervical region: Secondary | ICD-10-CM

## 2023-08-06 ENCOUNTER — Ambulatory Visit
Admission: RE | Admit: 2023-08-06 | Discharge: 2023-08-06 | Disposition: A | Discharge status: Home / Self Care | Payer: BC Managed Care – PPO | Source: Ambulatory Visit | Attending: Neurosurgery | Admitting: Neurosurgery

## 2023-08-06 ENCOUNTER — Encounter: Payer: Self-pay | Admitting: Neurology

## 2023-08-06 ENCOUNTER — Encounter: Payer: Self-pay | Admitting: Neurosurgery

## 2023-08-06 ENCOUNTER — Other Ambulatory Visit: Payer: Self-pay

## 2023-08-06 ENCOUNTER — Ambulatory Visit
Admission: RE | Admit: 2023-08-06 | Discharge: 2023-08-06 | Disposition: A | Discharge status: Home / Self Care | Payer: BC Managed Care – PPO | Attending: Neurosurgery | Admitting: Neurosurgery

## 2023-08-06 ENCOUNTER — Ambulatory Visit (INDEPENDENT_AMBULATORY_CARE_PROVIDER_SITE_OTHER): Payer: BC Managed Care – PPO | Admitting: Neurosurgery

## 2023-08-06 VITALS — BP 130/78 | Ht 60.0 in | Wt 157.0 lb

## 2023-08-06 DIAGNOSIS — G8929 Other chronic pain: Secondary | ICD-10-CM | POA: Diagnosis not present

## 2023-08-06 DIAGNOSIS — M5412 Radiculopathy, cervical region: Secondary | ICD-10-CM | POA: Insufficient documentation

## 2023-08-06 DIAGNOSIS — R202 Paresthesia of skin: Secondary | ICD-10-CM

## 2023-08-06 DIAGNOSIS — G5623 Lesion of ulnar nerve, bilateral upper limbs: Secondary | ICD-10-CM

## 2023-08-06 DIAGNOSIS — M546 Pain in thoracic spine: Secondary | ICD-10-CM | POA: Diagnosis not present

## 2023-08-06 NOTE — Progress Notes (Signed)
   REFERRING PHYSICIAN:  Rayetta Humphrey, Md [address] [place],  [place] [address]  DOS: 10/29/22 C6-C7 arthroplasty  HISTORY OF PRESENT ILLNESS: Annaelle Vallone is status post C6-C7 arthroplasty.    She is having some neck pain with activity.  However, this is not her worst issue.  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  She is also having pain in her right forearm on the ulnar aspect of her forearm that causes numbness and tingling into her hand in the fourth and fifth digit.  This occurs when she flexes her wrist.   PHYSICAL EXAMINATION:  NEUROLOGICAL:  General: In no acute distress.   Awake, alert, oriented to person, place, and time.  Pupils equal round and reactive to light.  Facial tone is symmetric.    Strength: Side Biceps Triceps Deltoid Interossei Grip Wrist Ext. Wrist Flex.  R 5 5 5 5 5 5 5   L 5 5 5 5 5 5 5    Incision c/d/I.   Imaging:  No complications noted   Assessment / Plan: Kida Sabic is doing well s/p above surgery.  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  It is positional on the right side.  She has had an injection on the left.  She is awaiting nerve conduction study which has not been scheduled.  I do think a nerve conduction study is indicated on both of her arms that she has symptoms of ulnar neuropathy on both sides.  Depending on the results, she may be a candidate for injection or decompression.  For her lower back and thoracic back pain, I will send her a referral to a chiropractor locally.  I spent a total of 15 minutes in this patient's care today. This time was spent reviewing pertinent records including imaging studies, obtaining and confirming history, performing a directed evaluation, formulating and discussing my recommendations, and documenting the visit within the medical record.    Venetia Night Dept of Neurosurgery

## 2023-09-05 ENCOUNTER — Ambulatory Visit (INDEPENDENT_AMBULATORY_CARE_PROVIDER_SITE_OTHER): Payer: BC Managed Care – PPO | Admitting: Neurology

## 2023-09-05 DIAGNOSIS — G5601 Carpal tunnel syndrome, right upper limb: Secondary | ICD-10-CM

## 2023-09-05 DIAGNOSIS — R202 Paresthesia of skin: Secondary | ICD-10-CM

## 2023-09-05 DIAGNOSIS — M5412 Radiculopathy, cervical region: Secondary | ICD-10-CM

## 2023-09-05 NOTE — Procedures (Signed)
[facility]  [address] [place], [address]  [place], [place] [address] Tel: [phone] Fax: [phone] Test Date:  09/05/2023  [name] DOB: [birth year] Physician: Nita Sickle, DO  Sex: Female Height: 5\' 0"  Ref Phys: Venetia Night, MD  ID#: [id number]   Technician:    History: This is a 42 year old female s/p C6-7 arthroplasty referred for evaluation of bilateral hand paresthesias, concerning for ulnar neuropathy.  NCV & EMG Findings: Extensive electrodiagnostic testing of the right upper extremity and additional studies of the left shows:  Right median sensory response shows mildly prolonged latency (R3.6 ms).  Left median, left mixed palmar, and bilateral ulnar sensory responses are within normal limits. Right median motor response shows mildly prolonged latency (R4.1 ms).  Left median and bilateral ulnar motor responses are within normal limits. 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  These findings are not present in the left upper extremity.  Impression: Right median neuropathy at or distal to the wrist, consistent with a clinical diagnosis of carpal tunnel syndrome.  Overall, these findings are moderate in degree electrically. Chronic C7 radiculopathy affecting the right upper extremity, moderate. In particular, there is no evidence of an ulnar neuropathy affecting the upper extremities.   ___________________________ Nita Sickle, DO    Nerve Conduction Studies   Stim Site NR Peak (ms) Norm Peak (ms) O-P Amp (V) Norm O-P Amp  Left Median Anti Sensory (2nd Digit)  32 C  Wrist    3.2 <3.4 47.7 >20  Right Median Anti Sensory (2nd Digit)  32 C  Wrist    *3.6 <3.4 42.2 >20  Left Ulnar Anti Sensory (5th Digit)  32 C  Wrist    2.6 <3.1 54.4 >12  Right Ulnar Anti Sensory (5th Digit)  32 C  Wrist    2.3 <3.1 55.2 >12     Stim Site NR Onset (ms) Norm Onset  (ms) O-P Amp (mV) Norm O-P Amp Site1 Site2 Delta-0 (ms) Dist (cm) Vel (m/s) Norm Vel (m/s)  Left Median Motor (Abd Poll Brev)  32 C  Wrist    3.5 <3.9 12.4 >6 Elbow Wrist 4.7 28.0 60 >50  Elbow    8.2  11.9         Right Median Motor (Abd Poll Brev)  32 C  Wrist    *4.1 <3.9 12.8 >6 Elbow Wrist 4.3 28.0 65 >50  Elbow    8.4  12.8         Left Ulnar Motor (Abd Dig Minimi)  32 C  Wrist    2.1 <3.1 12.0 >7 B Elbow Wrist 3.5 22.0 63 >50  B Elbow    5.6  11.1  A Elbow B Elbow 1.6 10.0 62 >50  A Elbow    7.2  11.0         Right Ulnar Motor (Abd Dig Minimi)  32 C  Wrist    2.3 <3.1 13.6 >7 B Elbow Wrist 3.4 23.0 68 >50  B Elbow    5.7  12.7  A Elbow B Elbow 1.5 10.0 67 >50  A Elbow    7.2  12.4            Stim Site NR Peak (ms) Norm Peak (ms) P-T Amp (V) Site1 Site2 Delta-P (ms) Norm Delta (ms)  Left Median/Ulnar Palm Comparison (Wrist - 8cm)  32 C  Median Palm    2.0 <2.2 86.4 Median Palm [employer]  0.1   Ulnar Palm    1.9 <2.2 24.3       Electromyography   Side Muscle Ins.Act Fibs Fasc Recrt Amp Dur Poly Activation Comment  Right 1stDorInt Nml Nml Nml Nml Nml Nml Nml Nml N/A  Right Abd Poll Brev Nml Nml Nml Nml Nml Nml Nml Nml N/A  Right PronatorTeres Nml Nml Nml *2- *1+ *1+ *1+ Nml N/A  Right Biceps Nml Nml Nml Nml Nml Nml Nml Nml N/A  Right Triceps Nml Nml Nml *2- *1+ *1+ *1+ Nml N/A  Right Deltoid Nml Nml Nml Nml Nml Nml Nml Nml N/A  Left 1stDorInt Nml Nml Nml Nml Nml Nml Nml Nml N/A  Left Abd Poll Brev Nml Nml Nml Nml Nml Nml Nml Nml N/A  Left PronatorTeres Nml Nml Nml Nml Nml Nml Nml Nml N/A  Left Biceps Nml Nml Nml Nml Nml Nml Nml Nml N/A  Left Triceps Nml Nml Nml Nml Nml Nml Nml Nml N/A  Left Deltoid Nml Nml Nml Nml Nml Nml Nml Nml N/A      Waveforms:

## 2023-09-06 ENCOUNTER — Encounter: Payer: Self-pay | Admitting: Neurosurgery

## 2023-09-24 NOTE — Progress Notes (Unsigned)
Referring Physician:  No referring provider defined for this encounter.  Primary Physician:  Rayetta Humphrey, MD  History of Present Illness: 09/25/2023 [name] is here today with a chief complaint of bilateral upper extremity discomfort.  Has a history of a left-sided carpal tunnel release.  Recent concerns for left-sided lateral epicondylitis with recent injections.  She is here today to discuss her right upper extremity.  She has symptoms going throughout her entire hand.  She notes 2 separate symptoms.  1 includes waking numbness and tingling in her hand on the lateral aspect in the median distribution.  This is exacerbated by certain wrist positions.  She also has symptoms in her ulnar distribution which she also feels like his consistent with some positional changes.  She states that these pains are quite bothersome and affect her quality of life.  She has had a previous C6-7 arthroplasty for a cervical radiculopathy.  Review of Systems:  A 10 point review of systems is negative, except for the pertinent positives and negatives detailed in the HPI.  Past Medical History: Past Medical History:  Diagnosis Date   Anxiety    Cervical radiculopathy    Depression    Hypothyroidism    Migraines    Mild obstructive sleep apnea    Pre-diabetes    Uterine fibroid     Past Surgical History: Past Surgical History:  Procedure Laterality Date   CARPAL TUNNEL RELEASE Left 06/08/2020   Procedure: CARPAL TUNNEL RELEASE ENDOSCOPIC;  Surgeon: Christena Flake, MD;  Location: [facility] ORS;  Service: Orthopedics;  Laterality: Left;   CERVICAL DISC ARTHROPLASTY N/A 10/29/2022   Procedure: C6-7 ARTHROPLASTY;  Surgeon: Venetia Night, MD;  Location: [facility] OR[facility];  Service: Neurosurgery;  Laterality: N/A;   CHOLECYSTECTOMY  2006   COLONOSCOPY  2013, 2018   DILATION AND CURETTAGE OF UTERUS  2014   ESOPHAGOGASTRODUODENOSCOPY  2018   ESOPHAGOGASTRODUODENOSCOPY  07/23/2022   TUBAL  LIGATION  2016    Allergies: Allergies as of 09/25/2023 - Review Complete 09/25/2023  Allergen Reaction Noted   Iodinated contrast media Rash 10/12/2013   Methocarbamol Other (See Comments) 11/05/2022   Other Itching and Rash 11/13/2022    Medications:  Current Outpatient Medications:    celecoxib (CELEBREX) 200 MG capsule, Take 1 capsule by mouth 2 (two) times daily., Disp: , Rfl:    SUMAtriptan (IMITREX) 25 MG tablet, Take 25 mg by mouth every 2 (two) hours as needed., Disp: , Rfl:    tranexamic acid (LYSTEDA) 650 MG TABS tablet, Take 1,300 mg by mouth 3 (three) times daily., Disp: , Rfl:    Vitamin D, Ergocalciferol, (DRISDOL) 1.25 MG (50000 UNIT) CAPS capsule, Take 50,000 Units by mouth once a week., Disp: , Rfl:   Social History: Social History   Tobacco Use   Smoking status: Never   Smokeless tobacco: Never  Vaping Use   Vaping status: Never Used  Substance Use Topics   Alcohol use: Yes    Comment: rarely wine   Drug use: Never    Family Medical History: Family History  Family history unknown: Yes    Physical Examination: Vitals:   09/25/23 1001  BP: 112/80    General: Patient is in no apparent distress. Attention to examination is appropriate.  Neck:   Supple.  Full range of motion.  Respiratory: Patient is breathing without any difficulty.   NEUROLOGICAL:     Awake, alert, oriented to person, place, and time.  Speech is clear and fluent.  Cranial Nerves: Pupils equal round and reactive to light.  Facial tone is symmetric. Shoulder shrug is symmetric. Tongue protrusion is midline.  There is no pronator drift.  Motor Exam:  Somewhat limited to pain throughout the intrinsic hand exam, no clear evidence of muscle wasting.  At least 4+ out of 5 throughout all tested motor groups.  Decreased in both the median and ulnar distribution.  Positive Tinel at the cubital tunnel and ulnar nerve distribution.  Positive Phalen's, reverse Phalen's, carpal  compression test on the right side  Gait is normal.    Medical Decision Making  Electrodiagnostics:  [facility]  [address] [place], [address]  [place], [place] [address] Tel: [phone] Fax: [phone] Test Date:  09/05/2023   [name] DOB: 06/08/1981 Physician: Nita Sickle, DO  Sex: Female Height: 5\' 0"  Ref Phys: Venetia Night, MD  ID#: [id number]     Technician:      History: This is a 42 year old female s/p C6-7 arthroplasty referred for evaluation of bilateral hand paresthesias, concerning for ulnar neuropathy.   NCV & EMG Findings: Extensive electrodiagnostic testing of the right upper extremity and additional studies of the left shows:  Right median sensory response shows mildly prolonged latency (R3.6 ms).  Left median, left mixed palmar, and bilateral ulnar sensory responses are within normal limits. Right median motor response shows mildly prolonged latency (R4.1 ms).  Left median and bilateral ulnar motor responses are within normal limits. 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  These findings are not present in the left upper extremity.   Impression: Right median neuropathy at or distal to the wrist, consistent with a clinical diagnosis of carpal tunnel syndrome.  Overall, these findings are moderate in degree electrically. Chronic C7 radiculopathy affecting the right upper extremity, moderate. In particular, there is no evidence of an ulnar neuropathy affecting the upper extremities.     ___________________________ Nita Sickle, DO     Nerve Conduction Studies    Stim Site NR Peak (ms) Norm Peak (ms) O-P Amp (V) Norm O-P Amp  Left Median Anti Sensory (2nd Digit)  32 C  Wrist    3.2 <3.4 47.7 >20  Right Median Anti Sensory (2nd Digit)  32 C  Wrist    *3.6 <3.4 42.2 >20  Left Ulnar Anti Sensory (5th Digit)  32 C  Wrist    2.6 <3.1 54.4 >12   Right Ulnar Anti Sensory (5th Digit)  32 C  Wrist    2.3 <3.1 55.2 >12       Stim Site NR Onset (ms) Norm Onset (ms) O-P Amp (mV) Norm O-P Amp Site1 Site2 Delta-0 (ms) Dist (cm) Vel (m/s) Norm Vel (m/s)  Left Median Motor (Abd Poll Brev)  32 C  Wrist    3.5 <3.9 12.4 >6 Elbow Wrist 4.7 28.0 60 >50  Elbow    8.2   11.9                Right Median Motor (Abd Poll Brev)  32 C  Wrist    *4.1 <3.9 12.8 >6 Elbow Wrist 4.3 28.0 65 >50  Elbow    8.4   12.8                Left Ulnar Motor (Abd Dig Minimi)  32 C  Wrist    2.1 <3.1 12.0 >7 B Elbow Wrist 3.5 22.0 63 >50  B Elbow  5.6   11.1   A Elbow B Elbow 1.6 10.0 62 >50  A Elbow    7.2   11.0                Right Ulnar Motor (Abd Dig Minimi)  32 C  Wrist    2.3 <3.1 13.6 >7 B Elbow Wrist 3.4 23.0 68 >50  B Elbow    5.7   12.7   A Elbow B Elbow 1.5 10.0 67 >50  A Elbow    7.2   12.4                     Stim Site NR Peak (ms) Norm Peak (ms) P-T Amp (V) Site1 Site2 Delta-P (ms) Norm Delta (ms)  Left Median/Ulnar Palm Comparison (Wrist - 8cm)  32 C  Median Palm    2.0 <2.2 86.4 Median Palm Ulnar Palm 0.1    Ulnar Palm    1.9 <2.2 24.3            Electromyography    Side Muscle Ins.Act Fibs Fasc Recrt Amp Dur Poly Activation Comment  Right 1stDorInt Nml Nml Nml Nml Nml Nml Nml Nml N/A  Right Abd Poll Brev Nml Nml Nml Nml Nml Nml Nml Nml N/A  Right PronatorTeres Nml Nml Nml *2- *1+ *1+ *1+ Nml N/A  Right Biceps Nml Nml Nml Nml Nml Nml Nml Nml N/A  Right Triceps Nml Nml Nml *2- *1+ *1+ *1+ Nml N/A  Right Deltoid Nml Nml Nml Nml Nml Nml Nml Nml N/A  Left 1stDorInt Nml Nml Nml Nml Nml Nml Nml Nml N/A  Left Abd Poll Brev Nml Nml Nml Nml Nml Nml Nml Nml N/A  Left PronatorTeres Nml Nml Nml Nml Nml Nml Nml Nml N/A  Left Biceps Nml Nml Nml Nml Nml Nml Nml Nml N/A  Left Triceps Nml Nml Nml Nml Nml Nml Nml Nml N/A  Left Deltoid Nml Nml Nml Nml Nml Nml Nml Nml N/A         Waveforms:                                                                                                                       I have personally reviewed the images and electrodiagnostics and agree with the above interpretation.  Assessment and Plan: Ms. Pirrello is a pleasant 42 y.o. female with history of left-sided carpal tunnel syndrome as well as a presentation for left-sided epicondylitis with possible radial tunnel syndrome.  On the right upper extremity she has significant right sided carpal tunnel at the wrist as well as some symptoms in her ulnar distribution as well.  These are both separate in presentation and exacerbated by certain positions.  This is moderate on nerve conduction testing, we discussed more conservative therapy versus decompression.  She would like to undergo a decompression of her median nerve at the transverse carpal ligament as she had good results on the contralateral side.  As part of her  further workup she has had an EMG of her right upper extremity, but has not had a ultrasound of the ulnar nerve at the elbow.  Given the intermittent nature of her symptoms would like to evaluate whether or not she has any swelling or entrapment noted at the elbow, this is possible to be present in a double crush-like phenomenon since she has had a previous right sided radiculopathy.  Like to get a repeat left sided EMG specifically looking for possible radial tunnel syndrome versus radiculopathy or other mononeuropathy.    Lovenia Kim MD/MSCR Neurosurgery - Peripheral Nerve Surgery

## 2023-09-25 ENCOUNTER — Ambulatory Visit: Payer: BC Managed Care – PPO | Admitting: Neurosurgery

## 2023-09-25 ENCOUNTER — Other Ambulatory Visit: Payer: Self-pay

## 2023-09-25 ENCOUNTER — Encounter: Payer: Self-pay | Admitting: Neurosurgery

## 2023-09-25 ENCOUNTER — Encounter: Payer: Self-pay | Admitting: Neurology

## 2023-09-25 VITALS — BP 112/80 | Ht 61.0 in | Wt 156.0 lb

## 2023-09-25 DIAGNOSIS — R202 Paresthesia of skin: Secondary | ICD-10-CM

## 2023-09-25 DIAGNOSIS — Z01818 Encounter for other preprocedural examination: Secondary | ICD-10-CM

## 2023-09-25 DIAGNOSIS — G5603 Carpal tunnel syndrome, bilateral upper limbs: Secondary | ICD-10-CM | POA: Diagnosis not present

## 2023-09-25 DIAGNOSIS — G5632 Lesion of radial nerve, left upper limb: Secondary | ICD-10-CM

## 2023-09-25 DIAGNOSIS — G5630 Lesion of radial nerve, unspecified upper limb: Secondary | ICD-10-CM

## 2023-09-25 NOTE — Patient Instructions (Addendum)
Please see below for information in regards to your upcoming surgery:   Planned surgery: Right carpal tunnel release with ultrasound guidance, possible convert to open   Surgery date: 10/15/23 at [facility] (Medical Mall: [address], [place], [place] [address]) - you will find out your arrival time the business day before your surgery.   Pre-op appointment at [facility] Pre-admit Testing: we will call you with a date/time for this. If you are scheduled for an in person appointment, Pre-admit Testing is located on the first floor of the [facility] building, [address] [facility], [address]. Please bring all prescriptions in the original prescription bottles to your appointment. During this appointment, they will advise you which medications you can take the morning of surgery, and which medications you will need to hold for surgery. Labs (such as blood work, EKG) may be done at your pre-op appointment. You are not required to fast for these labs. Should you need to change your pre-op appointment, please call Pre-admit testing at [phone].     How to contact us:  If you have any questions/concerns before or after surgery, you can reach [place] at [phone], or you can send a mychart message. We can be reached by phone or mychart 8am-4pm, Monday-Friday.  *Please note: Calls after 4pm are forwarded to a third party answering service. Mychart messages are not routinely monitored during evenings, weekends, and holidays. Please call our office to contact the answering service for urgent concerns during non-business hours.   If you have FMLA/disability paperwork, please drop it off or fax it to [phone], attention Patty.   Appointments/FMLA & disability paperwork: Joycelyn Rua, & Flonnie Hailstone Registered Nurse/Surgery scheduler: Royston Cowper Medical Assistants: Nash Mantis Physician Assistants: Manning Charity, PA-C, Drake Leach, PA-C, & Joan Flores,  [place] Surgeons: Venetia Night, MD & Ernestine Mcmurray, MD

## 2023-09-27 DIAGNOSIS — G5601 Carpal tunnel syndrome, right upper limb: Secondary | ICD-10-CM

## 2023-09-27 HISTORY — DX: Carpal tunnel syndrome, right upper limb: G56.01

## 2023-09-30 ENCOUNTER — Telehealth: Payer: Self-pay | Admitting: Neurosurgery

## 2023-09-30 NOTE — Telephone Encounter (Signed)
Right carpal tunnel release with ultrasound guidance, possible convert to open on 10/15/23  She would like to reschedule her surgery to December. 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 She said anytime in December is better.

## 2023-10-01 NOTE — Telephone Encounter (Signed)
I spoke with the patient. Surgery has been moved to 10/29/23.

## 2023-10-02 ENCOUNTER — Other Ambulatory Visit: Payer: Self-pay

## 2023-10-02 ENCOUNTER — Telehealth: Payer: Self-pay

## 2023-10-02 DIAGNOSIS — Z800 Family history of malignant neoplasm of digestive organs: Secondary | ICD-10-CM

## 2023-10-02 DIAGNOSIS — Z1211 Encounter for screening for malignant neoplasm of colon: Secondary | ICD-10-CM

## 2023-10-02 MED ORDER — NA SULFATE-K SULFATE-MG SULF 17.5-3.13-1.6 GM/177ML PO SOLN: 1.0000 | Freq: Once | ORAL | 0 refills | Status: AC

## 2023-10-02 NOTE — Telephone Encounter (Signed)
Gastroenterology Pre-Procedure Review  Request Date: 11/13/23 Requesting Physician: Dr. Tobi Bastos  PATIENT REVIEW QUESTIONS: The patient responded to the following health history questions as indicated:    1. Are you having any GI issues? no 2. Do you have a personal history of Polyps? no 3. Do you have a family history of Colon Cancer or Polyps? yes (father colon cancer) 4. Diabetes Mellitus? no 5. Joint replacements in the past 12 months?no 6. Major health problems in the past 3 months?no 7. Any artificial heart valves, MVP, or defibrillator?no    MEDICATIONS & ALLERGIES:    Patient reports the following regarding taking any anticoagulation/antiplatelet therapy:   Plavix, Coumadin, Eliquis, Xarelto, Lovenox, Pradaxa, Brilinta, or Effient? no Aspirin? no  Patient confirms/reports the following medications:  Current Outpatient Medications  Medication Sig Dispense Refill   celecoxib (CELEBREX) 200 MG capsule Take 1 capsule by mouth 2 (two) times daily.     SUMAtriptan (IMITREX) 25 MG tablet Take 25 mg by mouth every 2 (two) hours as needed.     tranexamic acid (LYSTEDA) 650 MG TABS tablet Take 1,300 mg by mouth 3 (three) times daily.     Vitamin D, Ergocalciferol, (DRISDOL) 1.25 MG (50000 UNIT) CAPS capsule Take 50,000 Units by mouth once a week.     No current facility-administered medications for this visit.    Patient confirms/reports the following allergies:  Allergies  Allergen Reactions   Iodinated Contrast Media Rash   Methocarbamol Other (See Comments)    Joint pain   Other Itching and Rash    Dermabond    No orders of the defined types were placed in this encounter.   AUTHORIZATION INFORMATION Primary Insurance: 1D#: Group #:  Secondary Insurance: 1D#: Group #:  SCHEDULE INFORMATION: Date: 11/13/23 Time: Location: [facility]

## 2023-10-07 ENCOUNTER — Other Ambulatory Visit: Payer: BC Managed Care – PPO

## 2023-10-08 ENCOUNTER — Ambulatory Visit (INDEPENDENT_AMBULATORY_CARE_PROVIDER_SITE_OTHER): Payer: BC Managed Care – PPO | Admitting: Neurology

## 2023-10-08 DIAGNOSIS — R202 Paresthesia of skin: Secondary | ICD-10-CM

## 2023-10-08 DIAGNOSIS — G5603 Carpal tunnel syndrome, bilateral upper limbs: Secondary | ICD-10-CM

## 2023-10-08 NOTE — Procedures (Signed)
[facility]  [address] [place], [address]  [place], [place] [address] Tel: [phone] Fax: [phone] Test Date:  10/08/2023  [name] DOB: 30-Mar-1981 Physician: Jacquelyne Balint, MD  Sex: Female Height: 5\' 1"  Ref Phys: Ernestine Mcmurray, MD  ID#: [id number]   Technician:    History: This is a 42 year old female with bilateral upper limb pain.  Findings: High frequency (4.0-16.0 MHz) B-mode, nonvascular ultrasound of the bilateral upper limbs shows: Cross sectional areas (CSA) of right median (palm to mid upper arm) shows increased CSA at right wrist (14.3 mm2). CSA of left median (palm to mid upper arm) and bilateral ulnar (wrist to mid upper arm) nerves are within normal limits. Wrist to forearm ratios of bilateral median nerves is increased (left 2.00, right 2.23). There is no subluxation or dislocation of either ulnar nerve from the ulnar groove with maximal elbow flexion.  Impression: This is abnormal neuromuscular ultrasound. The findings are most consistent with the following: Ultrasonographic evidence of entrapment of the right median nerve at the wrist segment, based on increased CSA at the level of the pisiform bone and an increased ratio comparing the CSA measurement of the median nerve at the wrist to the forearm, consistent with a clinical diagnosis of carpal tunnel syndrome. Evidence of possible entrapment of the left median nerve at the wrist segment, based on an increased ratio comparing the cross sectional area (CSA) measurement of the median nerve at the wrist to the forearm. Although this finding may be seen in carpal tunnel syndrome, the more established criteria for ultrasonographic diagnosis (i.e., CSA> 13 mm2) is not present. No ultrasonographic evidence of entrapment/repetitive nerve trauma of the right or left ulnar nerves at the elbow segment. There is no subluxation or dislocation of either ulnar nerve from the ulnar groove with maximal  elbow flexion. No other obvious lesion involving the adjacent bone or tendon is identified. No definite vascular abnormalities.   _______________  Jacquelyne Balint, MD [facility]   Nerve Measurements   Site Area Segment Area Ratio Mobility Vascularity Comment   mm Norm   Norm     Left Median  Palm 10.4         Wrist 10.6  < 13.0         Forearm 5.3  < 10.7  Wrist - Forearm 2.0  < 1.50      Antecubital fossa 4.4  < 13.2         Mid-arm 5.8  < 13.1         Right Median  Palm 12.8         Wrist *14.3  < 13.0         Forearm 6.4  < 10.7  Wrist - Forearm 2.23  < 1.50      Antecubital fossa 5.8  < 13.2         Mid-arm 8.5  < 13.1         Left Ulnar  Wrist 2.3  < 10.0         Forearm 5.0  < 10.0  Elbow - Forearm 1.30      Distal elbow 7.0  < 10.0         Elbow 6.5  < 10.0         Proximal elbow 7.4  < 10.0         Mid-arm 7.2  < 10.0         Right Ulnar  Wrist 2.5  <  10.0         Forearm 5.0  < 10.0  Elbow - Forearm 1.08      Distal elbow 4.7  < 10.0         Elbow 5.4  < 10.0         Proximal elbow 5.9  < 10.0         Mid-arm 4.9  < 10.0          Ultrasound Images:

## 2023-10-08 NOTE — Procedures (Signed)
  [facility]  [address] [place], [address]  [place], [place] [address] Tel: [phone] Fax: [phone] Test Date:  10/08/2023  [name] DOB: March 24, 1981 Physician: Jacquelyne Balint, MD  Sex: Female Height: 5\' 1"  Ref Phys: Ernestine Mcmurray, MD  ID#: [id number]   Technician:    History: This is a 42 year old female with bilateral upper limb pain.  NCV & EMG Findings: Extensive electrodiagnostic evaluation of the left upper limb shows: Left median, ulnar, and radial sensory responses are within normal limits. Left radial (EIP) motor responses are within normal limits. There is no evidence of active or chronic motor axon loss changes affecting any of the tested muscles on needle examination. Motor unit configuration and recruitment pattern is within normal limits.  Impression: This is a normal study. Specifically: No electrodiagnostic evidence of a left radial mononeuropathy. No electrodiagnostic evidence of a left cervical (C5-C8) motor radiculopathy. Screening studies for left median or ulnar mononeuropathies are normal.    ___________________________ Jacquelyne Balint, MD    Nerve Conduction Studies Motor Nerve Results    Latency Amplitude F-Lat Segment Distance CV Comment  Site (ms) Norm (mV) Norm (ms)  (cm) (m/s) Norm   Left Radial (EIP) Motor  Forearm 1.68  < 2.9 4.9  > 2.0        Elbow 3.5 - 4.8 -        Upper arm 4.5 - 4.4 -  Upper arm-Forearm 20 71  > 49    Sensory Sites    Neg Peak Lat Amplitude (O-P) Segment Distance Velocity Comment  Site (ms) Norm (V) Norm  (cm) (ms)   Left Median Sensory  Wrist-Dig II 3.0  < 3.4 49  > 20 Wrist-Dig II 13    Left Radial Sensory  Forearm-Wrist 1.83  < 2.7 34  > 18 Forearm-Wrist 10    Left Ulnar Sensory  Wrist-Dig V 2.4  < 3.1 48  > 12 Wrist-Dig V 11     Electromyography   Side Muscle Ins.Act Fibs Fasc Recrt Amp Dur Poly Activation Comment  Left FDI Nml Nml Nml Nml Nml Nml Nml Nml N/A  Left EIP Nml  Nml Nml Nml Nml Nml Nml Nml N/A  Left ADM Nml Nml Nml Nml Nml Nml Nml Nml N/A  Left ED Nml Nml Nml Nml Nml Nml Nml Nml N/A  Left Brachiorad Nml Nml Nml Nml Nml Nml Nml Nml N/A  Left FDP Nml Nml Nml Nml Nml Nml Nml Nml N/A  Left Biceps Nml Nml Nml Nml Nml Nml Nml Nml N/A  Left Triceps Nml Nml Nml Nml Nml Nml Nml Nml N/A      Waveforms:  Motor    Sensory

## 2023-10-21 ENCOUNTER — Other Ambulatory Visit: Payer: Self-pay

## 2023-10-21 ENCOUNTER — Encounter
Admission: RE | Admit: 2023-10-21 | Discharge: 2023-10-21 | Disposition: A | Discharge status: Home / Self Care | Payer: BC Managed Care – PPO | Source: Ambulatory Visit | Attending: Neurosurgery | Admitting: Neurosurgery

## 2023-10-21 VITALS — Ht 61.0 in | Wt 156.0 lb

## 2023-10-21 DIAGNOSIS — Z01812 Encounter for preprocedural laboratory examination: Secondary | ICD-10-CM

## 2023-10-21 HISTORY — DX: Iron deficiency anemia secondary to blood loss (chronic): D50.0

## 2023-10-21 HISTORY — DX: Gastro-esophageal reflux disease without esophagitis: K21.9

## 2023-10-21 HISTORY — DX: Vitamin D deficiency, unspecified: E55.9

## 2023-10-21 NOTE — Patient Instructions (Addendum)
Your procedure is scheduled on: Tuesday, December 3 Report to the Registration Desk on the 1st floor of the [facility] To find out your arrival time, please call [phone] between 1PM - 3PM on: Monday, December 2 If your arrival time is 6:00 am, do not arrive before that time as the [facility] entrance doors do not open until 6:00 am.  REMEMBER: Instructions that are not followed completely may result in serious medical risk, up to and including death; or upon the discretion of your surgeon and anesthesiologist your surgery may need to be rescheduled.  Do not eat food after midnight the night before surgery.  No gum chewing or hard candies.  You may however, drink CLEAR liquids up to 2 hours before you are scheduled to arrive for your surgery. Do not drink anything within 2 hours of your scheduled arrival time.  Clear liquids include: - water  - apple juice without pulp - gatorade (not RED colors) - black coffee or tea (Do NOT add milk or creamers to the coffee or tea) Do NOT drink anything that is not on this list.  One week prior to surgery: starting November 26 Stop Anti-inflammatories (NSAIDS) such as Advil, Aleve, Ibuprofen, Motrin, Naproxen, Naprosyn and Aspirin based products such as Excedrin, Goody's Powder, BC Powder. Stop ANY OVER THE COUNTER supplements until after surgery.  You may however, continue to take Tylenol if needed for pain up until the day of surgery.  Continue taking all of your other prescription medications up until the day of surgery.  DO NOT TAKE ANY MEDICATIONS ON THE DAY OF SURGERY.  No Alcohol for 24 hours before or after surgery.  No Smoking including e-cigarettes for 24 hours before surgery.  No chewable tobacco products for at least 6 hours before surgery.  No nicotine patches on the day of surgery.  Do not use any "recreational" drugs for at least a week (preferably 2 weeks) before your surgery.  Please be advised that the combination  of cocaine and anesthesia may have negative outcomes, up to and including death. If you test positive for cocaine, your surgery will be cancelled.  On the morning of surgery brush your teeth with toothpaste and water, you may rinse your mouth with mouthwash if you wish. Do not swallow any toothpaste or mouthwash.  Use CHG Soap as directed on instruction sheet.  Do not wear jewelry, make-up, hairpins, clips or nail polish.  For welded (permanent) jewelry: bracelets, anklets, waist bands, etc.  Please have this removed prior to surgery.  If it is not removed, there is a chance that hospital personnel will need to cut it off on the day of surgery.  Do not wear lotions, powders, or perfumes.   Do not shave body hair from the neck down 48 hours before surgery.  Contact lenses, hearing aids and dentures may not be worn into surgery.  Do not bring valuables to the hospital. [facility] is not responsible for any missing/lost belongings or valuables.   Bring your C-PAP to the hospital in case you may have to spend the night.   Notify your doctor if there is any change in your medical condition (cold, fever, infection).  Wear comfortable clothing (specific to your surgery type) to the hospital.  After surgery, you can help prevent lung complications by doing breathing exercises.  Take deep breaths and cough every 1-2 hours.   If you are being discharged the day of surgery, you will not be allowed to drive  home. You will need a responsible individual to drive you home and stay with you for 24 hours after surgery.   If you are taking public transportation, you will need to have a responsible individual with you.  Please call the Pre-admissions Testing Dept. at [phone] if you have any questions about these instructions.  Surgery Visitation Policy:  Patients having surgery or a procedure may have two visitors.  Children under the age of 51 must have an adult with them who is not the  patient.      Preparing for Surgery with CHLORHEXIDINE GLUCONATE (CHG) Soap  Chlorhexidine Gluconate (CHG) Soap  o An antiseptic cleaner that kills germs and bonds with the skin to continue killing germs even after washing  o Used for showering the night before surgery and morning of surgery  Before surgery, you can play an important role by reducing the number of germs on your skin.  CHG (Chlorhexidine gluconate) soap is an antiseptic cleanser which kills germs and bonds with the skin to continue killing germs even after washing.  Please do not use if you have an allergy to CHG or antibacterial soaps. If your skin becomes reddened/irritated stop using the CHG.  1. Shower the NIGHT BEFORE SURGERY and the MORNING OF SURGERY with CHG soap.  2. If you choose to wash your hair, wash your hair first as usual with your normal shampoo.  3. After shampooing, rinse your hair and body thoroughly to remove the shampoo.  4. Use CHG as you would any other liquid soap. You can apply CHG directly to the skin and wash gently with a scrungie or a clean washcloth.  5. Apply the CHG soap to your body only from the neck down. Do not use on open wounds or open sores. Avoid contact with your eyes, ears, mouth, and genitals (private parts). Wash face and genitals (private parts) with your normal soap.  6. Wash thoroughly, paying special attention to the area where your surgery will be performed.  7. Thoroughly rinse your body with warm water.  8. Do not shower/wash with your normal soap after using and rinsing off the CHG soap.  9. Pat yourself dry with a clean towel.  10. Wear clean pajamas to bed the night before surgery.  12. Place clean sheets on your bed the night of your first shower and do not sleep with pets.  13. Shower again with the CHG soap on the day of surgery prior to arriving at the hospital.  14. Do not apply any deodorants/lotions/powders.  15. Please wear clean clothes to the  hospital.

## 2023-10-28 MED ORDER — CEFAZOLIN IN SODIUM CHLORIDE 2-0.9 GM/100ML-% IV SOLN
2.0000 g | Freq: Once | INTRAVENOUS | Status: DC
  Filled 2023-10-28: qty 100

## 2023-10-28 MED ORDER — CHLORHEXIDINE GLUCONATE 0.12 % MT SOLN: 15.0000 mL | Freq: Once | OROMUCOSAL | Status: DC

## 2023-10-28 MED ORDER — ORAL CARE MOUTH RINSE: 15.0000 mL | Freq: Once | OROMUCOSAL | Status: DC

## 2023-10-28 MED ORDER — ACETAMINOPHEN 500 MG PO TABS: 1000.0000 mg | ORAL_TABLET | Freq: Once | ORAL | Status: DC

## 2023-10-28 MED ORDER — LACTATED RINGERS IV SOLN: INTRAVENOUS | Status: DC

## 2023-10-28 NOTE — Anesthesia Preprocedure Evaluation (Signed)
Anesthesia Evaluation  Patient identified by MRN, date of birth, ID band Patient awake    Reviewed: Allergy & Precautions, NPO status , Patient's Chart, lab work & pertinent test results  Airway Mallampati: III  TM Distance: >3 FB Neck ROM: full    Dental no notable dental hx.    Pulmonary sleep apnea    Pulmonary exam normal        Cardiovascular Exercise Tolerance: Good negative cardio ROS  Rhythm:Regular Rate:Normal + Systolic murmurs- Peripheral Edema    Neuro/Psych  PSYCHIATRIC DISORDERS Anxiety Depression     Neuromuscular disease    GI/Hepatic negative GI ROS, Neg liver ROS,,,  Endo/Other  Hypothyroidism  pre-diabetes Hyperthyroidism  Renal/GU      Musculoskeletal   Abdominal Normal abdominal exam  (+)   Peds  Hematology  (+) Blood dyscrasia, anemia   Anesthesia Other Findings Past Medical History: No date: Anxiety No date: Cervical radiculopathy No date: Depression No date: Hypothyroidism No date: Migraines 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 No date: Pre-diabetes No date: Uterine fibroid  Past Surgical History: 06/08/2020: CARPAL TUNNEL RELEASE; Left     Comment:  Procedure: CARPAL TUNNEL RELEASE ENDOSCOPIC;  Surgeon:               Christena Flake, MD;  Location: [facility] ORS;  Service:               Orthopedics;  Laterality: Left; 2006: CHOLECYSTECTOMY 2013, 2018: COLONOSCOPY 2014: DILATION AND CURETTAGE OF UTERUS 2018: ESOPHAGOGASTRODUODENOSCOPY 07/23/2022: ESOPHAGOGASTRODUODENOSCOPY 2016: TUBAL LIGATION     Reproductive/Obstetrics negative OB ROS                              Anesthesia Physical Anesthesia Plan  ASA: 2  Anesthesia Plan: General   Post-op Pain Management: Tylenol PO (pre-op)* and Minimal or no pain anticipated   Induction: Intravenous  PONV Risk Score and Plan: 3 and Ondansetron, Dexamethasone and Midazolam  Airway Management Planned:  Natural Airway  Additional Equipment:   Intra-op Plan:   Post-operative Plan:   Informed Consent:      Dental Advisory Given  Plan Discussed with: Anesthesiologist, CRNA and Surgeon  Anesthesia Plan Comments:          Anesthesia Quick Evaluation

## 2023-10-29 ENCOUNTER — Other Ambulatory Visit: Payer: Self-pay

## 2023-10-29 ENCOUNTER — Encounter: Payer: Self-pay | Admitting: Neurosurgery

## 2023-10-29 ENCOUNTER — Ambulatory Visit
Admission: RE | Admit: 2023-10-29 | Discharge: 2023-10-29 | Disposition: A | Discharge status: Home / Self Care | Payer: BC Managed Care – PPO | Attending: Neurosurgery | Admitting: Neurosurgery

## 2023-10-29 ENCOUNTER — Ambulatory Visit: Payer: Self-pay | Admitting: Anesthesiology

## 2023-10-29 ENCOUNTER — Encounter
Admission: RE | Disposition: A | Discharge status: Home / Self Care | Payer: Self-pay | Source: Home / Self Care | Attending: Neurosurgery

## 2023-10-29 ENCOUNTER — Ambulatory Visit: Payer: BC Managed Care – PPO | Admitting: Anesthesiology

## 2023-10-29 DIAGNOSIS — G4733 Obstructive sleep apnea (adult) (pediatric): Secondary | ICD-10-CM | POA: Diagnosis not present

## 2023-10-29 DIAGNOSIS — G5603 Carpal tunnel syndrome, bilateral upper limbs: Secondary | ICD-10-CM | POA: Diagnosis present

## 2023-10-29 DIAGNOSIS — G5601 Carpal tunnel syndrome, right upper limb: Secondary | ICD-10-CM | POA: Diagnosis present

## 2023-10-29 DIAGNOSIS — Z01818 Encounter for other preprocedural examination: Secondary | ICD-10-CM

## 2023-10-29 DIAGNOSIS — R7303 Prediabetes: Secondary | ICD-10-CM | POA: Diagnosis not present

## 2023-10-29 DIAGNOSIS — Z01812 Encounter for preprocedural laboratory examination: Secondary | ICD-10-CM

## 2023-10-29 HISTORY — PX: OPERATIVE ULTRASOUND: SHX5996

## 2023-10-29 HISTORY — PX: CARPAL TUNNEL RELEASE: SHX101

## 2023-10-29 LAB — POCT PREGNANCY, URINE: Preg Test, Ur: NEGATIVE

## 2023-10-29 SURGERY — CARPAL TUNNEL RELEASE
Anesthesia: General | Site: Wrist | Laterality: Right

## 2023-10-29 MED ORDER — PHENYLEPHRINE 80 MCG/ML (10ML) SYRINGE FOR IV PUSH (FOR BLOOD PRESSURE SUPPORT)
PREFILLED_SYRINGE | INTRAVENOUS | Status: AC
  Filled 2023-10-29: qty 10

## 2023-10-29 MED ORDER — OXYCODONE HCL 5 MG PO TABS
5.0000 mg | ORAL_TABLET | Freq: Once | ORAL | Status: AC | PRN
  Administered 2023-10-29: 5 mg via ORAL

## 2023-10-29 MED ORDER — ACETAMINOPHEN 10 MG/ML IV SOLN
1000.0000 mg | Freq: Once | INTRAVENOUS | Status: DC | PRN
  Administered 2023-10-29: 1000 mg via INTRAVENOUS

## 2023-10-29 MED ORDER — FENTANYL CITRATE (PF) 100 MCG/2ML IJ SOLN
INTRAMUSCULAR | Status: AC
  Filled 2023-10-29: qty 2

## 2023-10-29 MED ORDER — CEFAZOLIN SODIUM-DEXTROSE 2-4 GM/100ML-% IV SOLN
2.0000 g | INTRAVENOUS | Status: AC
  Administered 2023-10-29: 2 g via INTRAVENOUS

## 2023-10-29 MED ORDER — FENTANYL CITRATE (PF) 100 MCG/2ML IJ SOLN
INTRAMUSCULAR | Status: DC | PRN
  Administered 2023-10-29: 25 ug via INTRAVENOUS

## 2023-10-29 MED ORDER — TRAMADOL HCL 50 MG PO TABS: 50.0000 mg | ORAL_TABLET | Freq: Four times a day (QID) | ORAL | 0 refills | Status: DC | PRN

## 2023-10-29 MED ORDER — CEFAZOLIN SODIUM-DEXTROSE 2-4 GM/100ML-% IV SOLN
INTRAVENOUS | Status: AC
  Filled 2023-10-29: qty 100

## 2023-10-29 MED ORDER — LIDOCAINE HCL 1 % IJ SOLN
INTRAMUSCULAR | Status: DC | PRN
  Administered 2023-10-29: 4 mL

## 2023-10-29 MED ORDER — LIDOCAINE HCL (PF) 1 % IJ SOLN
INTRAMUSCULAR | Status: AC
  Filled 2023-10-29: qty 30

## 2023-10-29 MED ORDER — CHLORHEXIDINE GLUCONATE 0.12 % MT SOLN
OROMUCOSAL | Status: AC
  Filled 2023-10-29: qty 15

## 2023-10-29 MED ORDER — DEXMEDETOMIDINE HCL IN NACL 80 MCG/20ML IV SOLN
INTRAVENOUS | Status: DC | PRN
  Administered 2023-10-29: 12 ug via INTRAVENOUS

## 2023-10-29 MED ORDER — MIDAZOLAM HCL 2 MG/2ML IJ SOLN
INTRAMUSCULAR | Status: DC | PRN
  Administered 2023-10-29: 2 mg via INTRAVENOUS

## 2023-10-29 MED ORDER — PROPOFOL 10 MG/ML IV BOLUS
INTRAVENOUS | Status: DC | PRN
  Administered 2023-10-29: 70 mg via INTRAVENOUS

## 2023-10-29 MED ORDER — PHENYLEPHRINE 80 MCG/ML (10ML) SYRINGE FOR IV PUSH (FOR BLOOD PRESSURE SUPPORT)
PREFILLED_SYRINGE | INTRAVENOUS | Status: DC | PRN
  Administered 2023-10-29: 80 ug via INTRAVENOUS
  Administered 2023-10-29: 40 ug via INTRAVENOUS
  Administered 2023-10-29: 80 ug via INTRAVENOUS

## 2023-10-29 MED ORDER — DROPERIDOL 2.5 MG/ML IJ SOLN: 0.6250 mg | Freq: Once | INTRAMUSCULAR | Status: DC | PRN

## 2023-10-29 MED ORDER — PROPOFOL 500 MG/50ML IV EMUL
INTRAVENOUS | Status: DC | PRN
  Administered 2023-10-29: 100 ug/kg/min via INTRAVENOUS

## 2023-10-29 MED ORDER — 0.9 % SODIUM CHLORIDE (POUR BTL) OPTIME
TOPICAL | Status: DC | PRN
  Administered 2023-10-29: 500 mL

## 2023-10-29 MED ORDER — DEXAMETHASONE SODIUM PHOSPHATE 10 MG/ML IJ SOLN
INTRAMUSCULAR | Status: AC
  Filled 2023-10-29: qty 1

## 2023-10-29 MED ORDER — OXYCODONE HCL 5 MG/5ML PO SOLN: 5.0000 mg | Freq: Once | ORAL | Status: AC | PRN

## 2023-10-29 MED ORDER — ONDANSETRON HCL 4 MG/2ML IJ SOLN
INTRAMUSCULAR | Status: AC
  Filled 2023-10-29: qty 2

## 2023-10-29 MED ORDER — FENTANYL CITRATE (PF) 100 MCG/2ML IJ SOLN: 25.0000 ug | INTRAMUSCULAR | Status: DC | PRN

## 2023-10-29 MED ORDER — OXYCODONE HCL 5 MG PO TABS
ORAL_TABLET | ORAL | Status: AC
  Filled 2023-10-29: qty 1

## 2023-10-29 MED ORDER — DEXAMETHASONE SODIUM PHOSPHATE 10 MG/ML IJ SOLN
INTRAMUSCULAR | Status: DC | PRN
  Administered 2023-10-29: 10 mg via INTRAVENOUS

## 2023-10-29 MED ORDER — MIDAZOLAM HCL 2 MG/2ML IJ SOLN
INTRAMUSCULAR | Status: AC
  Filled 2023-10-29: qty 2

## 2023-10-29 MED ORDER — ACETAMINOPHEN 10 MG/ML IV SOLN
INTRAVENOUS | Status: AC
  Filled 2023-10-29: qty 100

## 2023-10-29 MED ORDER — ONDANSETRON HCL 4 MG/2ML IJ SOLN
INTRAMUSCULAR | Status: DC | PRN
  Administered 2023-10-29: 4 mg via INTRAVENOUS

## 2023-10-29 SURGICAL SUPPLY — 17 items
BNDG ADH 1X3 SHEER STRL LF (GAUZE/BANDAGES/DRESSINGS) ×1 IMPLANT
BNDG ADH 2 X3.75 FABRIC TAN LF (GAUZE/BANDAGES/DRESSINGS) ×1 IMPLANT
BRUSH SCRUB EZ 4% CHG (MISCELLANEOUS) ×1 IMPLANT
CHLORAPREP W/TINT 26 (MISCELLANEOUS) ×2 IMPLANT
COVER PROBE FLX POLY STRL (MISCELLANEOUS) ×1 IMPLANT
DERMABOND ADVANCED .7 DNX12 (GAUZE/BANDAGES/DRESSINGS) IMPLANT
DRAPE 3/4 80X56 (DRAPES) ×1 IMPLANT
DRAPE IMP U-DRAPE 54X76 (DRAPES) ×1 IMPLANT
GLOVE SRG 8 PF TXTR STRL LF DI (GLOVE) ×1 IMPLANT
GLOVE SURG SYN 7.5 E (GLOVE) ×1 IMPLANT
GLOVE SURG SYN 7.5 PF PI (GLOVE) ×1 IMPLANT
GOWN SRG XL LVL 3 NONREINFORCE (GOWNS) ×1 IMPLANT
KIT TURNOVER KIT A (KITS) ×1 IMPLANT
MANIFOLD NEPTUNE II (INSTRUMENTS) ×1 IMPLANT
NS IRRIG 500ML POUR BTL (IV SOLUTION) ×1 IMPLANT
PACK BASIN MINOR ARMC (MISCELLANEOUS) ×1 IMPLANT
ULTRAGLIDE CTR (BLADE) ×1 IMPLANT

## 2023-10-29 NOTE — Discharge Instructions (Addendum)
  Your surgeon has performed an operation on your hand to release your carpal tunnel and help with numbness and tingling.  For the first 3 days, please do not shower and reduce activity with your hand. After 3 days, please remove bandage and shower. Do not soak or scrub your incision. You may increase your lifting progressively to ten pounds after 3 days post-op.  Please use Tylenol as primary form of pain control. A prescription has been sent for breakthrough pain. Please only use as needed. If you do use, please take a stool softener and laxative over the counter to prevent constipation.   You may shower three days after your surgery.  After showering, lightly dab your incision dry. Do not take a tub bath or go swimming until approved by your doctor at your follow-up appointment.  Surgical Incision   If you have a dressing on your incision, you may remove it three days after your surgery. Keep your incision area clean and dry.  Should you experience any of the following, contact us immediately: New numbness or weakness Pain that is progressively getting worse, and is not relieved by your pain medication Bleeding, redness, swelling, pain, or drainage from surgical incision Chills or flu-like symptoms Fever greater than 101.0 F (38.3 C) Inability to eat, drink fluids, or take medications Problems with bowel or bladder functions Difficulty breathing or shortness of breath Warmth, tenderness, or swelling in your calf Contact Information How to contact us:  If you have any questions/concerns before or after surgery, you can reach [place] at [phone], or you can send a mychart message. We can be reached by phone or mychart 8am-4pm, Monday-Friday.  *Please note: Calls after 4pm are forwarded to a third party answering service. Mychart messages are not routinely monitored during evenings, weekends, and holidays. Please call our office to contact the answering service for urgent concerns during  non-business hours.

## 2023-10-29 NOTE — Consult Note (Signed)
Referring Physician:  No referring provider defined for this encounter.   Primary Physician:  Rayetta Humphrey, MD   History of Present Illness: 10/29/23 [name] is here today with a chief complaint of bilateral upper extremity discomfort.  Has a history of a left-sided carpal tunnel release.  Recent concerns for left-sided lateral epicondylitis with recent injections.   She is here today to discuss her right upper extremity.  She has symptoms going throughout her entire hand.  She notes 2 separate symptoms.  1 includes waking numbness and tingling in her hand on the lateral aspect in the median distribution.  This is exacerbated by certain wrist positions.   She also has symptoms in her ulnar distribution which she also feels like his consistent with some positional changes.  She states that these pains are quite bothersome and affect her quality of life.  She has had a previous C6-7 arthroplasty for a cervical radiculopathy.   Review of Systems:  A 10 point review of systems is negative, except for the pertinent positives and negatives detailed in the HPI.   Past Medical History:     Past Medical History:  Diagnosis Date   Anxiety     Cervical radiculopathy     Depression     Hypothyroidism     Migraines     Mild obstructive sleep apnea     Pre-diabetes     Uterine fibroid            Past Surgical History:      Past Surgical History:  Procedure Laterality Date   CARPAL TUNNEL RELEASE Left 06/08/2020    Procedure: CARPAL TUNNEL RELEASE ENDOSCOPIC;  Surgeon: Christena Flake, MD;  Location: [facility] ORS;  Service: Orthopedics;  Laterality: Left;   CERVICAL DISC ARTHROPLASTY N/A 10/29/2022    Procedure: C6-7 ARTHROPLASTY;  Surgeon: Venetia Night, MD;  Location: [facility] OR[facility];  Service: Neurosurgery;  Laterality: N/A;   CHOLECYSTECTOMY   2006   COLONOSCOPY   2013, 2018   DILATION AND CURETTAGE OF UTERUS   2014   ESOPHAGOGASTRODUODENOSCOPY   2018    ESOPHAGOGASTRODUODENOSCOPY   07/23/2022   TUBAL LIGATION   2016          Allergies:      Allergies as of 09/25/2023 - Review Complete 09/25/2023  Allergen Reaction Noted   Iodinated contrast media Rash 10/12/2013   Methocarbamol Other (See Comments) 11/05/2022   Other Itching and Rash 11/13/2022      Medications:  Current Medications    Current Outpatient Medications:    celecoxib (CELEBREX) 200 MG capsule, Take 1 capsule by mouth 2 (two) times daily., Disp: , Rfl:    SUMAtriptan (IMITREX) 25 MG tablet, Take 25 mg by mouth every 2 (two) hours as needed., Disp: , Rfl:    tranexamic acid (LYSTEDA) 650 MG TABS tablet, Take 1,300 mg by mouth 3 (three) times daily., Disp: , Rfl:    Vitamin D, Ergocalciferol, (DRISDOL) 1.25 MG (50000 UNIT) CAPS capsule, Take 50,000 Units by mouth once a week., Disp: , Rfl:      Social History: Social History  Social History         Tobacco Use   Smoking status: Never   Smokeless tobacco: Never  Vaping Use   Vaping status: Never Used  Substance Use Topics   Alcohol use: Yes      Comment: rarely wine   Drug use: Never        Family Medical History: Family History  Family history unknown: Yes          Physical Examination:    Vitals:    09/25/23 1001  BP: 112/80      General:Patient is in no apparent distress. Attention to examination is appropriate.   Neck:               Supple.  Full range of motion.   Respiratory:Patient is breathing without any difficulty.     NEUROLOGICAL:     Awake, alert, oriented to person, place, and time.  Speech is clear and fluent.    Cranial Nerves: Pupils equal round and reactive to light.  Facial tone is symmetric. Shoulder shrug is symmetric. Tongue protrusion is midline.  There is no pronator drift.   Motor Exam:  Somewhat limited to pain throughout the intrinsic hand exam, no clear evidence of muscle wasting.  At least 4+ out of 5 throughout all tested motor groups.   Decreased in both  the median and ulnar distribution.  Positive Tinel at the cubital tunnel and ulnar nerve distribution.  Positive Phalen's, reverse Phalen's, carpal compression test on the right side   Gait is normal.      Medical Decision Making   Electrodiagnostics:  [facility]  [address], [address]  [place], [place] [address] Tel: [phone] Fax: [phone] Test Date:  09/05/2023   [name] DOB: Dec 05, 1980 Physician: Nita Sickle, DO  Sex: Female Height: 5\' 0"  Ref Phys: Venetia Night, MD  ID#: [id number]     Technician:      History: This is a 42 year old female s/p C6-7 arthroplasty referred for evaluation of bilateral hand paresthesias, concerning for ulnar neuropathy.   NCV & EMG Findings: Extensive electrodiagnostic testing of the right upper extremity and additional studies of the left shows:  Right median sensory response shows mildly prolonged latency (R3.6 ms).  Left median, left mixed palmar, and bilateral ulnar sensory responses are within normal limits. Right median motor response shows mildly prolonged latency (R4.1 ms).  Left median and bilateral ulnar motor responses are within normal limits. 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  These findings are not present in the left upper extremity.   Impression: Right median neuropathy at or distal to the wrist, consistent with a clinical diagnosis of carpal tunnel syndrome.  Overall, these findings are moderate in degree electrically. Chronic C7 radiculopathy affecting the right upper extremity, moderate. In particular, there is no evidence of an ulnar neuropathy affecting the upper extremities.     ___________________________ Nita Sickle, DO     Nerve Conduction Studies    Stim Site NR Peak (ms) Norm Peak (ms) O-P Amp (V) Norm O-P Amp  Left Median Anti Sensory (2nd Digit)  32 C  Wrist    3.2 <3.4 47.7 >20   Right Median Anti Sensory (2nd Digit)  32 C  Wrist    *3.6 <3.4 42.2 >20  Left Ulnar Anti Sensory (5th Digit)  32 C  Wrist    2.6 <3.1 54.4 >12  Right Ulnar Anti Sensory (5th Digit)  32 C  Wrist    2.3 <3.1 55.2 >12       Stim Site NR Onset (ms) Norm Onset (ms) O-P Amp (mV) Norm O-P Amp Site1 Site2 Delta-0 (ms) Dist (cm) Vel (m/s) Norm Vel (m/s)  Left Median Motor (Abd Poll Brev)  32 C  Wrist    3.5 <3.9 12.4 >6 Elbow Wrist 4.7  28.0 60 >50  Elbow    8.2   11.9                Right Median Motor (Abd Poll Brev)  32 C  Wrist    *4.1 <3.9 12.8 >6 Elbow Wrist 4.3 28.0 65 >50  Elbow    8.4   12.8                Left Ulnar Motor (Abd Dig Minimi)  32 C  Wrist    2.1 <3.1 12.0 >7 B Elbow Wrist 3.5 22.0 63 >50  B Elbow    5.6   11.1   A Elbow B Elbow 1.6 10.0 62 >50  A Elbow    7.2   11.0                Right Ulnar Motor (Abd Dig Minimi)  32 C  Wrist    2.3 <3.1 13.6 >7 B Elbow Wrist 3.4 23.0 68 >50  B Elbow    5.7   12.7   A Elbow B Elbow 1.5 10.0 67 >50  A Elbow    7.2   12.4                     Stim Site NR Peak (ms) Norm Peak (ms) P-T Amp (V) Site1 Site2 Delta-P (ms) Norm Delta (ms)  Left Median/Ulnar Palm Comparison (Wrist - 8cm)  32 C  Median Palm    2.0 <2.2 86.4 Median Palm Ulnar Palm 0.1    Ulnar Palm    1.9 <2.2 24.3            Electromyography    Side Muscle Ins.Act Fibs Fasc Recrt Amp Dur Poly Activation Comment  Right 1stDorInt Nml Nml Nml Nml Nml Nml Nml Nml N/A  Right Abd Poll Brev Nml Nml Nml Nml Nml Nml Nml Nml N/A  Right PronatorTeres Nml Nml Nml *2- *1+ *1+ *1+ Nml N/A  Right Biceps Nml Nml Nml Nml Nml Nml Nml Nml N/A  Right Triceps Nml Nml Nml *2- *1+ *1+ *1+ Nml N/A  Right Deltoid Nml Nml Nml Nml Nml Nml Nml Nml N/A  Left 1stDorInt Nml Nml Nml Nml Nml Nml Nml Nml N/A  Left Abd Poll Brev Nml Nml Nml Nml Nml Nml Nml Nml N/A  Left PronatorTeres Nml Nml Nml Nml Nml Nml Nml Nml N/A  Left Biceps Nml Nml Nml Nml Nml Nml Nml Nml N/A  Left Triceps Nml Nml Nml  Nml Nml Nml Nml Nml N/A  Left Deltoid Nml Nml Nml Nml Nml Nml Nml Nml N/A          Waveforms:                                                                                                                       I have personally reviewed the images and electrodiagnostics and agree with the above interpretation.   Assessment and Plan: Valerie Pearson is a  pleasant 42 y.o. female with history of left-sided carpal tunnel syndrome as well as a presentation for left-sided epicondylitis with possible radial tunnel syndrome.  On the right upper extremity she has significant right sided carpal tunnel at the wrist as well as some symptoms in her ulnar distribution as well.   She would like to undergo a decompression of her median nerve at the transverse carpal ligament as she had good results on the contralateral side.       Lovenia Kim MD/MSCR Neurosurgery - Peripheral Nerve Surgery

## 2023-10-29 NOTE — Interval H&P Note (Signed)
History and Physical Interval Note:  10/29/2023 9:50 AM  Valerie Pearson  has presented today for surgery, with the diagnosis of G56.01 right carpal tunnel syndrome.  The various methods of treatment have been discussed with the patient and family. After consideration of risks, benefits and other options for treatment, the patient has consented to  Procedure(s): RIGHT CARPAL TUNNEL RELEASE WITH ULTRASOUND GUIDANCE, POSSIBLE CONVERT TO OPEN (Right) OPERATIVE ULTRASOUND (Right) as a surgical intervention.  The patient's history has been reviewed, patient examined, no change in status, stable for surgery.  I have reviewed the patient's chart and labs.  Questions were answered to the patient's satisfaction.    Heart and lungs clear  Lovenia Kim

## 2023-10-29 NOTE — Op Note (Signed)
Indications: Patient with a history of median neuropathy at the wrist with hand weakness refractory to conservative management.  Findings: Severe compression of the median nerve at the transverse carpal ligament  Preoperative Diagnosis: Right side carpal tunnel syndrome Postoperative Diagnosis: same   EBL: Minimal IVF: See anesthesia report Drains: none Disposition:Stable to PACU Complications: none  No foley catheter was placed.   Preoperative Note: patient with a history of progressive right median neuropathy with hand weakness refractory to conservative management.  They had tried rest, padding, and watchful waiting but had continued progressive symptoms.  Given the progression of her median neuropathy plan was made for median nerve decompression  Risk of surgery is discussed and include: Infection, bleeding, wound healing issues, pillar pain nerve injury, pain, failure to relieve the symptoms, need for further surgery.  Procedure:  1) right sided carpal tunnel decompression with ultrasound guidance   Procedure: After obtaining informed consent, the patient taken to the operating room, placed in supine position, monitored anesthesia care was induced.  They were given preoperative antibiotics.  Prepped and draped in the usual fashion.  Comprehensive timeout was performed verifying the patient's name, MRN, planned procedure.  An ultrasound was used with a sterile probe.  We used this to mark out our safety points including the interval between the ulnar artery and median nerve.  We identified the motor branch as well as the first sensory branch which were both in safe position.  We also identified the vascular arcade which was in safe positioning as well.  At this point we placed a block with Marcaine without epinephrine.  We blocked the skin where the incision would be, the superficial sensory median nerve, as well as the transverse carpal ligament.  Under ultrasound guidance we  utilized the local anesthetic to perform a hydrodissection of the nerve from the transverse carpal ligament.  We then prepped the sonex ultra CTR knife on the back table while the anesthetic set in.  We performed a small linear incision approximately 2 to 3 mm.  We then utilized a Statistician under ultrasound guidance to identify the underside of the transverse carpal ligament.  The fat and connective tissue was dissected off the underside.  We could feel that this was quite thickened and calcified.  Causing severe compression.  Once we had a clear tract we then placed the ultrasound-guided knife into the incision and advanced it through the carpal tunnel.  We verified the safety zones including the median nerve which did not significantly cross over the knife.  We are able to see the first sensory branch which was not crossing the knife.  The artery was also in a safe place.  At this point we divided the transverse carpal ligament under ultrasound guidance, to get a full release it took 2 passes.  The nerve relaxed laterally and was well decompressed.  After the 2 passes we placed a [place] 4 into the wound and were able to feel a complete dissection of the transverse carpal ligament.  We then irrigated, we got meticulous hemostasis.  Skin glue was placed on the incision and a Band-Aid was placed on top once this was dried.  No immediate complications.  Sponge and pattie counts were correct at the end of the procedure.   I performed the procedure without an assistant surgeon  Lovenia Kim, MD/MSCR

## 2023-10-29 NOTE — H&P (View-Only) (Signed)
Referring Physician:  No referring provider defined for this encounter.   Primary Physician:  Rayetta Humphrey, MD   History of Present Illness: 10/29/23 [name] is here today with a chief complaint of bilateral upper extremity discomfort.  Has a history of a left-sided carpal tunnel release.  Recent concerns for left-sided lateral epicondylitis with recent injections.   She is here today to discuss her right upper extremity.  She has symptoms going throughout her entire hand.  She notes 2 separate symptoms.  1 includes waking numbness and tingling in her hand on the lateral aspect in the median distribution.  This is exacerbated by certain wrist positions.   She also has symptoms in her ulnar distribution which she also feels like his consistent with some positional changes.  She states that these pains are quite bothersome and affect her quality of life.  She has had a previous C6-7 arthroplasty for a cervical radiculopathy.   Review of Systems:  A 10 point review of systems is negative, except for the pertinent positives and negatives detailed in the HPI.   Past Medical History:     Past Medical History:  Diagnosis Date   Anxiety     Cervical radiculopathy     Depression     Hypothyroidism     Migraines     Mild obstructive sleep apnea     Pre-diabetes     Uterine fibroid            Past Surgical History:      Past Surgical History:  Procedure Laterality Date   CARPAL TUNNEL RELEASE Left 06/08/2020    Procedure: CARPAL TUNNEL RELEASE ENDOSCOPIC;  Surgeon: Christena Flake, MD;  Location: [facility];  Service: Orthopedics;  Laterality: Left;   CERVICAL DISC ARTHROPLASTY N/A 10/29/2022    Procedure: C6-7 ARTHROPLASTY;  Surgeon: Venetia Night, MD;  Location: [facility];  Service: Neurosurgery;  Laterality: N/A;   CHOLECYSTECTOMY   2006   COLONOSCOPY   2013, 2018   DILATION AND CURETTAGE OF UTERUS   2014   ESOPHAGOGASTRODUODENOSCOPY   2018    ESOPHAGOGASTRODUODENOSCOPY   07/23/2022   TUBAL LIGATION   2016          Allergies:      Allergies as of 09/25/2023 - Review Complete 09/25/2023  Allergen Reaction Noted   Iodinated contrast media Rash 10/12/2013   Methocarbamol Other (See Comments) 11/05/2022   Other Itching and Rash 11/13/2022      Medications:  Current Medications    Current Outpatient Medications:    celecoxib (CELEBREX) 200 MG capsule, Take 1 capsule by mouth 2 (two) times daily., Disp: , Rfl:    SUMAtriptan (IMITREX) 25 MG tablet, Take 25 mg by mouth every 2 (two) hours as needed., Disp: , Rfl:    tranexamic acid (LYSTEDA) 650 MG TABS tablet, Take 1,300 mg by mouth 3 (three) times daily., Disp: , Rfl:    Vitamin D, Ergocalciferol, (DRISDOL) 1.25 MG (50000 UNIT) CAPS capsule, Take 50,000 Units by mouth once a week., Disp: , Rfl:      Social History: Social History  Social History         Tobacco Use   Smoking status: Never   Smokeless tobacco: Never  Vaping Use   Vaping status: Never Used  Substance Use Topics   Alcohol use: Yes      Comment: rarely wine   Drug use: Never        Family Medical History: Family History  Family history unknown: Yes          Physical Examination:    Vitals:    09/25/23 1001  BP: 112/80      General:Patient is in no apparent distress. Attention to examination is appropriate.   Neck:               Supple.  Full range of motion.   Respiratory:Patient is breathing without any difficulty.     NEUROLOGICAL:     Awake, alert, oriented to person, place, and time.  Speech is clear and fluent.    Cranial Nerves: Pupils equal round and reactive to light.  Facial tone is symmetric. Shoulder shrug is symmetric. Tongue protrusion is midline.  There is no pronator drift.   Motor Exam:  Somewhat limited to pain throughout the intrinsic hand exam, no clear evidence of muscle wasting.  At least 4+ out of 5 throughout all tested motor groups.   Decreased in both  the median and ulnar distribution.  Positive Tinel at the cubital tunnel and ulnar nerve distribution.  Positive Phalen's, reverse Phalen's, carpal compression test on the right side   Gait is normal.      Medical Decision Making   Electrodiagnostics:  [facility]  [address], [address]  [place], [place] [address] Tel: [phone] Fax: [phone] Test Date:  09/05/2023   [name] DOB: Dec 05, 1980 Physician: Nita Sickle, DO  Sex: Female Height: 5\' 0"  Ref Phys: Venetia Night, MD  ID#: [id number]     Technician:      History: This is a 42 year old female s/p C6-7 arthroplasty referred for evaluation of bilateral hand paresthesias, concerning for ulnar neuropathy.   NCV & EMG Findings: Extensive electrodiagnostic testing of the right upper extremity and additional studies of the left shows:  Right median sensory response shows mildly prolonged latency (R3.6 ms).  Left median, left mixed palmar, and bilateral ulnar sensory responses are within normal limits. Right median motor response shows mildly prolonged latency (R4.1 ms).  Left median and bilateral ulnar motor responses are within normal limits. 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  These findings are not present in the left upper extremity.   Impression: Right median neuropathy at or distal to the wrist, consistent with a clinical diagnosis of carpal tunnel syndrome.  Overall, these findings are moderate in degree electrically. Chronic C7 radiculopathy affecting the right upper extremity, moderate. In particular, there is no evidence of an ulnar neuropathy affecting the upper extremities.     ___________________________ Nita Sickle, DO     Nerve Conduction Studies    Stim Site NR Peak (ms) Norm Peak (ms) O-P Amp (V) Norm O-P Amp  Left Median Anti Sensory (2nd Digit)  32 C  Wrist    3.2 <3.4 47.7 >20   Right Median Anti Sensory (2nd Digit)  32 C  Wrist    *3.6 <3.4 42.2 >20  Left Ulnar Anti Sensory (5th Digit)  32 C  Wrist    2.6 <3.1 54.4 >12  Right Ulnar Anti Sensory (5th Digit)  32 C  Wrist    2.3 <3.1 55.2 >12       Stim Site NR Onset (ms) Norm Onset (ms) O-P Amp (mV) Norm O-P Amp Site1 Site2 Delta-0 (ms) Dist (cm) Vel (m/s) Norm Vel (m/s)  Left Median Motor (Abd Poll Brev)  32 C  Wrist    3.5 <3.9 12.4 >6 Elbow Wrist 4.7  28.0 60 >50  Elbow    8.2   11.9                Right Median Motor (Abd Poll Brev)  32 C  Wrist    *4.1 <3.9 12.8 >6 Elbow Wrist 4.3 28.0 65 >50  Elbow    8.4   12.8                Left Ulnar Motor (Abd Dig Minimi)  32 C  Wrist    2.1 <3.1 12.0 >7 B Elbow Wrist 3.5 22.0 63 >50  B Elbow    5.6   11.1   A Elbow B Elbow 1.6 10.0 62 >50  A Elbow    7.2   11.0                Right Ulnar Motor (Abd Dig Minimi)  32 C  Wrist    2.3 <3.1 13.6 >7 B Elbow Wrist 3.4 23.0 68 >50  B Elbow    5.7   12.7   A Elbow B Elbow 1.5 10.0 67 >50  A Elbow    7.2   12.4                     Stim Site NR Peak (ms) Norm Peak (ms) P-T Amp (V) Site1 Site2 Delta-P (ms) Norm Delta (ms)  Left Median/Ulnar Palm Comparison (Wrist - 8cm)  32 C  Median Palm    2.0 <2.2 86.4 Median Palm Ulnar Palm 0.1    Ulnar Palm    1.9 <2.2 24.3            Electromyography    Side Muscle Ins.Act Fibs Fasc Recrt Amp Dur Poly Activation Comment  Right 1stDorInt Nml Nml Nml Nml Nml Nml Nml Nml N/A  Right Abd Poll Brev Nml Nml Nml Nml Nml Nml Nml Nml N/A  Right PronatorTeres Nml Nml Nml *2- *1+ *1+ *1+ Nml N/A  Right Biceps Nml Nml Nml Nml Nml Nml Nml Nml N/A  Right Triceps Nml Nml Nml *2- *1+ *1+ *1+ Nml N/A  Right Deltoid Nml Nml Nml Nml Nml Nml Nml Nml N/A  Left 1stDorInt Nml Nml Nml Nml Nml Nml Nml Nml N/A  Left Abd Poll Brev Nml Nml Nml Nml Nml Nml Nml Nml N/A  Left PronatorTeres Nml Nml Nml Nml Nml Nml Nml Nml N/A  Left Biceps Nml Nml Nml Nml Nml Nml Nml Nml N/A  Left Triceps Nml Nml Nml  Nml Nml Nml Nml Nml N/A  Left Deltoid Nml Nml Nml Nml Nml Nml Nml Nml N/A          Waveforms:                                                                                                                       I have personally reviewed the images and electrodiagnostics and agree with the above interpretation.   Assessment and Plan: Ms. Odonoghue is a  pleasant 42 y.o. female with history of left-sided carpal tunnel syndrome as well as a presentation for left-sided epicondylitis with possible radial tunnel syndrome.  On the right upper extremity she has significant right sided carpal tunnel at the wrist as well as some symptoms in her ulnar distribution as well.   She would like to undergo a decompression of her median nerve at the transverse carpal ligament as she had good results on the contralateral side.       Lovenia Kim MD/MSCR Neurosurgery - Peripheral Nerve Surgery

## 2023-10-29 NOTE — Transfer of Care (Signed)
Immediate Anesthesia Transfer of Care Note  [name]  Procedure(s) Performed: RIGHT CARPAL TUNNEL RELEASE WITH ULTRASOUND GUIDANCE (Right: Wrist) OPERATIVE ULTRASOUND (Right: Wrist)  Patient Location: PACU  Anesthesia Type:General  Level of Consciousness: awake, alert , and oriented  Airway & Oxygen Therapy: Patient Spontanous Breathing  Post-op Assessment: Report given to RN and Post -op Vital signs reviewed and stable  Post vital signs: Reviewed and stable  Last Vitals:  Vitals Value Taken Time  BP 101/62 10/29/23 1047  Temp    Pulse 68 10/29/23 1049  Resp 14 10/29/23 1049  SpO2 99 % 10/29/23 1049  Vitals shown include unfiled device data.  Last Pain:  Vitals:   10/29/23 0859  TempSrc: Temporal  PainSc: 0-No pain      Patients Stated Pain Goal: 0 (10/29/23 0859)  Complications: No notable events documented.

## 2023-10-30 ENCOUNTER — Encounter: Payer: BC Managed Care – PPO | Admitting: Physician Assistant

## 2023-10-30 NOTE — Anesthesia Postprocedure Evaluation (Signed)
Anesthesia Post Note  Patient: Purity Benet  Procedure(s) Performed: RIGHT CARPAL TUNNEL RELEASE WITH ULTRASOUND GUIDANCE (Right: Wrist) OPERATIVE ULTRASOUND (Right: Wrist)  Patient location during evaluation: PACU Anesthesia Type: General Level of consciousness: awake and alert Pain management: pain level controlled Vital Signs Assessment: post-procedure vital signs reviewed and stable Respiratory status: spontaneous breathing, nonlabored ventilation and respiratory function stable Cardiovascular status: blood pressure returned to baseline and stable Postop Assessment: no apparent nausea or vomiting Anesthetic complications: no   No notable events documented.   Last Vitals:  Vitals:   10/29/23 1130 10/29/23 1146  BP: 104/67   Pulse: (!) 59   Resp: 13   Temp: 36.8 C 36.9 C  SpO2: 100%     Last Pain:  Vitals:   10/30/23 0838  TempSrc:   PainSc: 1                  Foye Deer

## 2023-11-04 ENCOUNTER — Telehealth: Payer: Self-pay | Admitting: Neurosurgery

## 2023-11-04 NOTE — Telephone Encounter (Signed)
Right carpal tunnel release with ultrasound guidance, possible convert to open on 10/29/23  Patient is calling. She would like to confirm when she can return to work and her restrictions. She states that she still has a lot of pain in her right hand.

## 2023-11-06 NOTE — Telephone Encounter (Signed)
Left message for patient to call me back regarding [employer] form.

## 2023-11-06 NOTE — Telephone Encounter (Signed)
She is wondering if Dr.Smith could prescribe medication for her hand pain. She states that the pain starts in the middle of her hand into her fingers. She said it does not have to be oxy just something to help her get some relief. [facility] [address]

## 2023-11-07 MED ORDER — METHYLPREDNISOLONE 4 MG PO TBPK
ORAL_TABLET | ORAL | 0 refills | Status: DC
Start: 2023-11-07 — End: 2023-12-11

## 2023-11-07 NOTE — Telephone Encounter (Addendum)
Discussed with Dr Katrinka Blazing. Can do medrol dosepack, then switch to ibuprofen as needed after. I spoke with the patient. She is agreeable to this. Medrol dosepack has been sent to [facility] [address].

## 2023-11-11 ENCOUNTER — Ambulatory Visit (INDEPENDENT_AMBULATORY_CARE_PROVIDER_SITE_OTHER): Payer: BC Managed Care – PPO | Admitting: Physician Assistant

## 2023-11-11 VITALS — BP 118/78 | Temp 99.4°F | Ht 61.0 in | Wt 156.0 lb

## 2023-11-11 DIAGNOSIS — G5601 Carpal tunnel syndrome, right upper limb: Secondary | ICD-10-CM

## 2023-11-11 DIAGNOSIS — Z09 Encounter for follow-up examination after completed treatment for conditions other than malignant neoplasm: Secondary | ICD-10-CM

## 2023-11-11 DIAGNOSIS — G5603 Carpal tunnel syndrome, bilateral upper limbs: Secondary | ICD-10-CM

## 2023-11-11 NOTE — Progress Notes (Signed)
   REFERRING PHYSICIAN:  Rayetta Humphrey, Md [address] [place],  [place] [address]  DOS: 10/29/2023; Right carpal tunnel release  HISTORY OF PRESENT ILLNESS: Valerie Pearson is 2 weeks  status post right carpal tunnel release. Overall, she is doing well, but is having some hand and wrist soreness.  She feels as though this is likely linked to her lifting heavy at work and not allowing her hand to rest.  The Medrol Dosepak that was prescribed she had has helped her some.  She notices that when she flexes her wrist it increases her pain.  PHYSICAL EXAMINATION:  NEUROLOGICAL:  General: In no acute distress.   Awake, alert, oriented to person, place, and time.  Pupils equal round and reactive to light.  Facial tone is symmetric.    Right upper extremity hand strength is to baseline.   Incision c/d/I    Assessment / Plan: Valerie Pearson is doing well after right carpal tunnel release 2 weeks ago.  I have advised her not to lift more than 10 to 15 pounds over the course of the next month.  A note was provided for her work.  Plan to see back in 1 month.  If patient continues to have pillar pain, which is a known result of surgery, would likely refer her to a occupational hand therapist in the future if she would like.  Plan to return in clinic at 6 weeks postop   Advised to contact the office if any questions or concerns arise.   Joan Flores PA-C Dept of Neurosurgery

## 2023-11-13 ENCOUNTER — Encounter
Admission: RE | Disposition: A | Discharge status: Home / Self Care | Payer: Self-pay | Source: Home / Self Care | Attending: Gastroenterology

## 2023-11-13 ENCOUNTER — Ambulatory Visit: Payer: BC Managed Care – PPO | Admitting: General Practice

## 2023-11-13 ENCOUNTER — Other Ambulatory Visit: Payer: Self-pay

## 2023-11-13 ENCOUNTER — Ambulatory Visit
Admission: RE | Admit: 2023-11-13 | Discharge: 2023-11-13 | Disposition: A | Discharge status: Home / Self Care | Payer: BC Managed Care – PPO | Attending: Gastroenterology | Admitting: Gastroenterology

## 2023-11-13 ENCOUNTER — Encounter: Payer: Self-pay | Admitting: Gastroenterology

## 2023-11-13 DIAGNOSIS — Z800 Family history of malignant neoplasm of digestive organs: Secondary | ICD-10-CM

## 2023-11-13 DIAGNOSIS — E039 Hypothyroidism, unspecified: Secondary | ICD-10-CM | POA: Diagnosis not present

## 2023-11-13 DIAGNOSIS — Z5986 Financial insecurity: Secondary | ICD-10-CM | POA: Diagnosis not present

## 2023-11-13 DIAGNOSIS — G4730 Sleep apnea, unspecified: Secondary | ICD-10-CM | POA: Insufficient documentation

## 2023-11-13 DIAGNOSIS — R7303 Prediabetes: Secondary | ICD-10-CM | POA: Diagnosis not present

## 2023-11-13 DIAGNOSIS — Z1211 Encounter for screening for malignant neoplasm of colon: Secondary | ICD-10-CM | POA: Diagnosis present

## 2023-11-13 HISTORY — PX: COLONOSCOPY WITH PROPOFOL: SHX5780

## 2023-11-13 LAB — POCT PREGNANCY, URINE: Preg Test, Ur: NEGATIVE

## 2023-11-13 SURGERY — COLONOSCOPY WITH PROPOFOL
Anesthesia: General

## 2023-11-13 MED ORDER — LIDOCAINE HCL (CARDIAC) PF 100 MG/5ML IV SOSY
PREFILLED_SYRINGE | INTRAVENOUS | Status: DC | PRN
  Administered 2023-11-13: 100 mg via INTRAVENOUS

## 2023-11-13 MED ORDER — PROPOFOL 10 MG/ML IV BOLUS
INTRAVENOUS | Status: DC | PRN
  Administered 2023-11-13: 50 mg via INTRAVENOUS
  Administered 2023-11-13: 150 ug/kg/min via INTRAVENOUS

## 2023-11-13 MED ORDER — SODIUM CHLORIDE 0.9 % IV SOLN: INTRAVENOUS | Status: DC

## 2023-11-13 NOTE — Anesthesia Postprocedure Evaluation (Signed)
Anesthesia Post Note  [name]  Procedure(s) Performed: COLONOSCOPY WITH PROPOFOL  Patient location during evaluation: PACU Anesthesia Type: General Level of consciousness: awake and alert Pain management: pain level controlled Vital Signs Assessment: post-procedure vital signs reviewed and stable Respiratory status: spontaneous breathing, nonlabored ventilation, respiratory function stable and patient connected to nasal cannula oxygen Cardiovascular status: blood pressure returned to baseline and stable Postop Assessment: no apparent nausea or vomiting Anesthetic complications: no  There were no known notable events for this encounter.   Last Vitals:  Vitals:   11/13/23 0844 11/13/23 0854  BP: 109/70 125/70  Pulse: 82 67  Resp: 15 12  Temp: (!) 35.9 C   SpO2: 99% 100%    Last Pain:  Vitals:   11/13/23 0854  TempSrc:   PainSc: 0-No pain                 Stephanie Coup

## 2023-11-13 NOTE — Anesthesia Preprocedure Evaluation (Signed)
Anesthesia Evaluation  Patient identified by MRN, date of birth, ID band Patient awake    Reviewed: Allergy & Precautions, NPO status , Patient's Chart, lab work & pertinent test results  Airway Mallampati: III  TM Distance: >3 FB Neck ROM: full    Dental  (+) Missing,    Pulmonary sleep apnea    Pulmonary exam normal        Cardiovascular Exercise Tolerance: Good negative cardio ROS  Rhythm:Regular Rate:Normal - Peripheral Edema    Neuro/Psych  PSYCHIATRIC DISORDERS Anxiety Depression     Neuromuscular disease    GI/Hepatic negative GI ROS, Neg liver ROS,,,  Endo/Other  Hypothyroidism  pre-diabetes Hyperthyroidism  Renal/GU      Musculoskeletal   Abdominal Normal abdominal exam  (+)   Peds  Hematology  (+) Blood dyscrasia, anemia   Anesthesia Other Findings Past Medical History: No date: Anxiety No date: Cervical radiculopathy No date: Depression No date: Hypothyroidism No date: Migraines 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 No date: Pre-diabetes No date: Uterine fibroid  Past Surgical History: 06/08/2020: CARPAL TUNNEL RELEASE; Left     Comment:  Procedure: CARPAL TUNNEL RELEASE ENDOSCOPIC;  Surgeon:               Christena Flake, MD;  Location: [facility] OR[facility];  Service:               Orthopedics;  Laterality: Left; 2006: CHOLECYSTECTOMY 2013, 2018: COLONOSCOPY 2014: DILATION AND CURETTAGE OF UTERUS 2018: ESOPHAGOGASTRODUODENOSCOPY 07/23/2022: ESOPHAGOGASTRODUODENOSCOPY 2016: TUBAL LIGATION     Reproductive/Obstetrics negative OB ROS                             Anesthesia Physical Anesthesia Plan  ASA: 2  Anesthesia Plan: General   Post-op Pain Management: Minimal or no pain anticipated   Induction: Intravenous  PONV Risk Score and Plan: 3 and Ondansetron, Propofol infusion and TIVA  Airway Management Planned: Nasal Cannula and Natural Airway  Additional Equipment:  None  Intra-op Plan:   Post-operative Plan:   Informed Consent: I have reviewed the patients History and Physical, chart, labs and discussed the procedure including the risks, benefits and alternatives for the proposed anesthesia with the patient or authorized representative who has indicated his/her understanding and acceptance.     Dental Advisory Given  Plan Discussed with: Anesthesiologist, CRNA and Surgeon  Anesthesia Plan Comments: (Discussed risks of anesthesia with patient, including possibility of difficulty with spontaneous ventilation under anesthesia necessitating airway intervention, PONV, and rare risks such as cardiac or respiratory or neurological events, and allergic reactions. Discussed the role of CRNA in patient's perioperative care. Patient understands.)        Anesthesia Quick Evaluation

## 2023-11-13 NOTE — Transfer of Care (Signed)
Immediate Anesthesia Transfer of Care Note  Patient: Timera Hoblit  Procedure(s) Performed: COLONOSCOPY WITH PROPOFOL  Patient Location: PACU  Anesthesia Type:General  Level of Consciousness: drowsy and patient cooperative  Airway & Oxygen Therapy: Patient Spontanous Breathing and Patient connected to nasal cannula oxygen  Post-op Assessment: Report given to RN and Post -op Vital signs reviewed and stable  Post vital signs: stable  Last Vitals:  Vitals Value Taken Time  BP 109/70 11/13/23 0844  Temp 35.9 C 11/13/23 0844  Pulse 82 11/13/23 0845  Resp 13 11/13/23 0845  SpO2 99 % 11/13/23 0845  Vitals shown include unfiled device data.  Last Pain:  Vitals:   11/13/23 0844  TempSrc: Temporal  PainSc: Asleep         Complications: No notable events documented.

## 2023-11-13 NOTE — H&P (Signed)
Wyline Mood, MD [address], [place], [place], [address] [address], [place], [place], [address] Phone: [phone]  Fax: [phone]  Primary Care Physician:  Rayetta Humphrey, MD   Pre-Procedure History & Physical: HPI:  Valerie Pearson is a 42 y.o. female is here for an colonoscopy.   Past Medical History:  Diagnosis Date   Anxiety    Cervical radiculopathy    Depression    GERD (gastroesophageal reflux disease)    Hypothyroidism    Iron deficiency anemia due to chronic blood loss    Migraines    Mild obstructive sleep apnea    Pre-diabetes    Right carpal tunnel syndrome 09/2023   Uterine fibroid    Uterine fibroid    Vitamin D insufficiency     Past Surgical History:  Procedure Laterality Date   CARPAL TUNNEL RELEASE Left 06/08/2020   Procedure: CARPAL TUNNEL RELEASE ENDOSCOPIC;  Surgeon: Christena Flake, MD;  Location: [facility];  Service: Orthopedics;  Laterality: Left;   CARPAL TUNNEL RELEASE Right 10/29/2023   Procedure: RIGHT CARPAL TUNNEL RELEASE WITH ULTRASOUND GUIDANCE;  Surgeon: Lovenia Kim, MD;  Location: [facility];  Service: Neurosurgery;  Laterality: Right;   CERVICAL DISC ARTHROPLASTY N/A 10/29/2022   Procedure: C6-7 ARTHROPLASTY;  Surgeon: Venetia Night, MD;  Location: [facility];  Service: Neurosurgery;  Laterality: N/A;   CHOLECYSTECTOMY  2006   COLONOSCOPY  2013, 2018   DILATION AND CURETTAGE OF UTERUS  2014   ESOPHAGOGASTRODUODENOSCOPY  2018   ESOPHAGOGASTRODUODENOSCOPY  07/23/2022   OPERATIVE ULTRASOUND Right 10/29/2023   Procedure: OPERATIVE ULTRASOUND;  Surgeon: Lovenia Kim, MD;  Location: [facility];  Service: Neurosurgery;  Laterality: Right;   TUBAL LIGATION  2016    Prior to Admission medications   Medication Sig Start Date End Date Taking? Authorizing Provider  celecoxib (CELEBREX) 200 MG capsule Take 200 mg by mouth 2 (two) times daily as needed for moderate pain (pain score 4-6). 01/14/23  Yes  [provider]  methylPREDNISolone (MEDROL DOSEPAK) 4 MG TBPK tablet Take by mouth daily - taper daily dose per package instructions. 11/07/23  Yes Lovenia Kim, MD  SUMAtriptan (IMITREX) 25 MG tablet Take 25 mg by mouth every 2 (two) hours as needed for migraine. 12/19/22 12/19/23 Yes [provider]  tranexamic acid (LYSTEDA) 650 MG TABS tablet Take 1,300 mg by mouth 3 (three) times daily as needed (during menstrual cycle). 09/02/23  Yes [provider]  Vitamin D, Ergocalciferol, (DRISDOL) 1.25 MG (50000 UNIT) CAPS capsule Take 50,000 Units by mouth once a week. 08/04/23  Yes [provider]    Allergies as of 10/02/2023 - Review Complete 09/25/2023  Allergen Reaction Noted   Iodinated contrast media Rash 10/12/2013   Methocarbamol Other (See Comments) 11/05/2022   Other Itching and Rash 11/13/2022    Family History  Family history unknown: Yes    Social History   Socioeconomic History   Marital status: Married    Spouse name: Richard   Number of children: 3   Years of education: Not on file   Highest education level: Not on file  Occupational History   Not on file  Tobacco Use   Smoking status: Never   Smokeless tobacco: Never  Vaping Use   Vaping status: Never Used  Substance and Sexual Activity   Alcohol use: Yes    Comment: rarely wine   Drug use: Never   Sexual activity: Not on file  Other Topics Concern   Not on file  Social History Narrative   Not on file   Social Drivers of Health   Financial Resource Strain: Medium Risk (04/12/2023)   Received from [facility], [facility]   Overall Financial Resource Strain (CARDIA)    Difficulty of Paying Living Expenses: Somewhat hard  Food Insecurity: Food Insecurity Present (04/12/2023)   Received from [facility], [facility]   Hunger Vital Sign    Worried About Running Out of Food in the Last Year: Never true     Ran Out of Food in the Last Year: Sometimes true  Transportation Needs: No Transportation Needs (04/12/2023)   Received from [facility], [facility]   [facility] - Transportation    In the past 12 months, has lack of transportation kept you from medical appointments or from getting medications?: No    Lack of Transportation (Non-Medical): No  Physical Activity: Insufficiently Active (07/11/2023)   Received from [facility]   Exercise Vital Sign    Days of Exercise per Week: 2 days    Minutes of Exercise per Session: 30 min  Stress: Stress Concern Present (07/11/2023)   Received from [facility] Occupational [facility] - Occupational Stress Questionnaire    Feeling of Stress : Very much  Social Connections: Unknown (07/11/2023)   Received from [facility]   Social Connection and Isolation Panel [NHANES]    Frequency of Communication with Friends and Family: Patient unable to answer    Frequency of Social Gatherings with Friends and Family: Patient unable to answer    Attends Religious Services: Never    Database administrator or Organizations: No    Attends Engineer, structural: Never    Marital Status: Married  Catering manager Violence: Not on file    Review of Systems: See HPI, otherwise negative ROS  Physical Exam: BP 111/84   Pulse 82   Temp 97.6 F (36.4 C) (Temporal)   Resp 16   Ht 5\' 1"  (1.549 m)   Wt 65.8 kg   LMP 09/15/2023 (Approximate)   SpO2 100%   BMI 27.40 kg/m  General:   Alert,  pleasant and cooperative in NAD Head:  Normocephalic and atraumatic. Neck:  Supple; no masses or thyromegaly. Lungs:  Clear throughout to auscultation, normal respiratory effort.    Heart:  +S1, +S2, Regular rate and rhythm, No edema. Abdomen:  Soft, nontender and nondistended. Normal bowel sounds, without guarding, and without rebound.   Neurologic:  Alert and   oriented x4;  grossly normal neurologically.  Impression/Plan: Valerie Pearson is here for an colonoscopy to be performed for Screening colonoscopy , father had colon cancer Risks, benefits, limitations, and alternatives regarding  colonoscopy have been reviewed with the patient.  Questions have been answered.  All parties agreeable.   Wyline Mood, MD  11/13/2023, 7:49 AM

## 2023-11-13 NOTE — Op Note (Signed)
[facility] Gastroenterology Patient Name: Valerie Pearson Procedure Date: 11/13/2023 8:15 AM MRN: [id number] Account #: [id number] Date of Birth: 1981-04-13 Admit Type: Outpatient Age: 42 Room: [facility] ENDO ROOM 3 Gender: Female Note Status: Finalized Instrument Name: Nelda Marseille [id number] Procedure:             Colonoscopy Indications:           Screening in patient at increased risk: Family history                         of 1st-degree relative with colorectal cancer Providers:             Wyline Mood MD, MD Referring MD:          Marylin Crosby. Greggory Stallion MD, MD (Referring MD) Medicines:             Monitored Anesthesia Care Complications:         No immediate complications. Procedure:             Pre-Anesthesia Assessment:                        - Prior to the procedure, a History and Physical was                         performed, and patient medications, allergies and                         sensitivities were reviewed. The patient's tolerance                         of previous anesthesia was reviewed.                        - The risks and benefits of the procedure and the                         sedation options and risks were discussed with the                         patient. All questions were answered and informed                         consent was obtained.                        - ASA Grade Assessment: II - A patient with mild                         systemic disease.                        After obtaining informed consent, the colonoscope was                         passed under direct vision. Throughout the procedure,                         the patient's blood pressure, pulse, and oxygen  saturations were monitored continuously. The                         Colonoscope was introduced through the anus and                         advanced to the the cecum, identified by the                         appendiceal orifice. The colonoscopy  was performed                         with ease. The patient tolerated the procedure well.                         The quality of the bowel preparation was excellent.                         The ileocecal valve, appendiceal orifice, and rectum                         were photographed. Findings:      The entire examined colon appeared normal on direct and retroflexion       views. Impression:            - The entire examined colon is normal on direct and                         retroflexion views.                        - No specimens collected. Recommendation:        - Discharge patient to home (with escort).                        - Advance diet as tolerated.                        - Continue present medications.                        - Repeat colonoscopy in 5 years for surveillance. Procedure Code(s):     --- Professional ---                        [phone], Colonoscopy, flexible; diagnostic, including                         collection of specimen(s) by brushing or washing, when                         performed (separate procedure) Diagnosis Code(s):     --- Professional ---                        Z80.0, Family history of malignant neoplasm of                         digestive organs CPT copyright 2022 American [employer] Association. All rights reserved. The codes documented in this report are preliminary and upon coder  review may  be revised to meet current compliance requirements. Wyline Mood, MD Wyline Mood MD, MD 11/13/2023 8:43:31 AM This report has been signed electronically. Number of Addenda: 0 Note Initiated On: 11/13/2023 8:15 AM Scope Withdrawal Time: 0 hours 9 minutes 21 seconds  Total Procedure Duration: 0 hours 12 minutes 16 seconds  Estimated Blood Loss:  Estimated blood loss: none.      [facility]

## 2023-11-14 ENCOUNTER — Encounter: Payer: Self-pay | Admitting: Gastroenterology

## 2023-11-25 ENCOUNTER — Encounter: Payer: BC Managed Care – PPO | Admitting: Neurosurgery

## 2023-12-11 ENCOUNTER — Ambulatory Visit (INDEPENDENT_AMBULATORY_CARE_PROVIDER_SITE_OTHER): Payer: BC Managed Care – PPO | Admitting: Neurosurgery

## 2023-12-11 ENCOUNTER — Encounter: Payer: Self-pay | Admitting: Neurosurgery

## 2023-12-11 VITALS — BP 124/82 | Ht 61.0 in | Wt 145.0 lb

## 2023-12-11 DIAGNOSIS — G8918 Other acute postprocedural pain: Secondary | ICD-10-CM | POA: Insufficient documentation

## 2023-12-11 DIAGNOSIS — Z09 Encounter for follow-up examination after completed treatment for conditions other than malignant neoplasm: Secondary | ICD-10-CM

## 2023-12-11 DIAGNOSIS — G5603 Carpal tunnel syndrome, bilateral upper limbs: Secondary | ICD-10-CM

## 2023-12-11 NOTE — Patient Instructions (Signed)
 Referral made for hand therapy, [facility] is a local center to you.

## 2023-12-11 NOTE — Progress Notes (Signed)
   REFERRING PHYSICIAN:  Alexander Anes, Md [address] [place],  [place] [address]  DOS: 10/29/2023; Right carpal tunnel release  HISTORY OF PRESENT ILLNESS: Valerie Pearson is 6 weeks  status post right carpal tunnel release. Overall, she has noticed that her night time numbness has improved considerably. She is however continuing to have pain at the base of her wrist that is exacerbated by activity and movements.   PHYSICAL EXAMINATION:  NEUROLOGICAL:  General: In no acute distress.   Awake, alert, oriented to person, place, and time.  Pupils equal round and reactive to light.  Facial tone is symmetric.    Right upper extremity hand strength is to baseline.   Incision c/d/I    Assessment / Plan: Valerie Pearson is doing well after right carpal tunnel release 6 weeks ago. 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 We discussed with her about this entity and that is is generally self limited and will recover with hand therapy. We have made a referral for her to start hand therapy to help with the pillar pain. She was able to ask her questions, and we answered to her approval.   Advised to contact the office if any questions or concerns arise.   Carroll Clamp, MD Dept of Neurosurgery

## 2024-01-01 ENCOUNTER — Encounter: Payer: BC Managed Care – PPO | Admitting: Physician Assistant

## 2024-06-09 ENCOUNTER — Ambulatory Visit: Admission: EM | Admit: 2024-06-09 | Discharge: 2024-06-09 | Disposition: A | Discharge status: Home / Self Care

## 2024-06-09 ENCOUNTER — Encounter: Payer: Self-pay | Admitting: Emergency Medicine

## 2024-06-09 DIAGNOSIS — J069 Acute upper respiratory infection, unspecified: Secondary | ICD-10-CM | POA: Diagnosis not present

## 2024-06-09 MED ORDER — AMOXICILLIN-POT CLAVULANATE 875-125 MG PO TABS: 1.0000 | ORAL_TABLET | Freq: Two times a day (BID) | ORAL | 0 refills | Status: AC

## 2024-06-09 MED ORDER — PREDNISONE 10 MG (21) PO TBPK: ORAL_TABLET | Freq: Every day | ORAL | 0 refills | Status: AC

## 2024-06-09 MED ORDER — GUAIFENESIN-CODEINE 100-10 MG/5ML PO SOLN: 5.0000 mL | Freq: Four times a day (QID) | ORAL | 0 refills | Status: AC | PRN

## 2024-06-09 MED ORDER — BENZONATATE 100 MG PO CAPS: 100.0000 mg | ORAL_CAPSULE | Freq: Three times a day (TID) | ORAL | 0 refills | Status: AC

## 2024-06-09 NOTE — ED Triage Notes (Signed)
 Patient reports productive cough with yellow sputum, chest congestion and headaches x 3 days. Took Tylenol  flu 1 hour ago. Rates pain 7/10.

## 2024-06-09 NOTE — Discharge Instructions (Signed)
 Your symptoms today are most likely being caused by a virus and should steadily improve in time it can take up to 7 to 10 days before you truly start to see a turnaround however things will get better, if no improvement seen by Sunday may pick up antibiotic from the pharmacy  In the meantime take prednisone  every morning with food to open and relax the airway, should help with coughing, shortness of breath, chest soreness and sinus pressure, may take Tylenol  additionally  Use Tessalon  Perles every 8 hours for cough, may use cough syrup every 6 hours but be mindful this can make you feel drowsy    For cough: honey 1/2 to 1 teaspoon (you can dilute the honey in water or another fluid).  You can also use guaifenesin  and dextromethorphan for cough. You can use a humidifier for chest congestion and cough.  If you don't have a humidifier, you can sit in the bathroom with the hot shower running.      For sore throat: try warm salt water gargles, cepacol lozenges, throat spray, warm tea or water with lemon/honey, popsicles or ice, or OTC cold relief medicine for throat discomfort.   For congestion: take a daily anti-histamine like Zyrtec , Claritin, and a oral decongestant, such as pseudoephedrine.  You can also use Flonase  1-2 sprays in each nostril daily.   It is important to stay hydrated: drink plenty of fluids (water, gatorade/powerade/pedialyte, juices, or teas) to keep your throat moisturized and help further relieve irritation/discomfort.

## 2024-06-09 NOTE — ED Provider Notes (Signed)
 [facility]    CSN: [id number] Arrival date & time: 06/09/24  9146      History   Chief Complaint Chief Complaint  Patient presents with   Cough   Headache    HPI Valerie Pearson is a 43 y.o. female.   Patient presents for evaluation of nasal congestion, chest congestion, sore throat from coughing, sinus pressure, intermittent frontal headaches, a productive cough with yellow sputum present for 3 days.  Persistent coughing interfering with sleep, exacerbating headache and sore throat and causing centralized chest soreness which makes it painful to breathe.  No known sick contacts, denies recent travel.  Decreased appetite but tolerable to some food and liquids.  Has attempted use of over-the-counter Tylenol  flu.  Denies respiratory history, non-smoker.  Past Medical History:  Diagnosis Date   Anxiety    Cervical radiculopathy    Depression    GERD (gastroesophageal reflux disease)    Hypothyroidism    Iron deficiency anemia due to chronic blood loss    Migraines    Mild obstructive sleep apnea    Pre-diabetes    Right carpal tunnel syndrome 09/2023   Uterine fibroid    Uterine fibroid    Vitamin D insufficiency     Patient Active Problem List   Diagnosis Date Noted   Pillar pain, post-operative 12/11/2023   Family history of colon cancer in father 11/13/2023   Bilateral carpal tunnel syndrome 09/25/2023   Right arm weakness 10/29/2022   Radicular syndrome of upper limbs 10/29/2022   Impaired fasting glucose 07/27/2022   Cervical radiculopathy 07/26/2022   Hypothyroidism    Depression     Past Surgical History:  Procedure Laterality Date   CARPAL TUNNEL RELEASE Left 06/08/2020   Procedure: CARPAL TUNNEL RELEASE ENDOSCOPIC;  Surgeon: Edie Norleen PARAS, MD;  Location: [facility] ORS;  Service: Orthopedics;  Laterality: Left;   CARPAL TUNNEL RELEASE Right 10/29/2023   Procedure: RIGHT CARPAL TUNNEL RELEASE WITH ULTRASOUND GUIDANCE;  Surgeon: Claudene Penne ORN,  MD;  Location: [facility] ORS;  Service: Neurosurgery;  Laterality: Right;   CERVICAL DISC ARTHROPLASTY N/A 10/29/2022   Procedure: C6-7 ARTHROPLASTY;  Surgeon: Clois Fret, MD;  Location: [facility] ORS;  Service: Neurosurgery;  Laterality: N/A;   CHOLECYSTECTOMY  2006   COLONOSCOPY  2013, 2018   COLONOSCOPY WITH PROPOFOL  N/A 11/13/2023   Procedure: COLONOSCOPY WITH PROPOFOL ;  Surgeon: Therisa Bi, MD;  Location: [facility];  Service: Gastroenterology;  Laterality: N/A;   DILATION AND CURETTAGE OF UTERUS  2014   ESOPHAGOGASTRODUODENOSCOPY  2018   ESOPHAGOGASTRODUODENOSCOPY  07/23/2022   OPERATIVE ULTRASOUND Right 10/29/2023   Procedure: OPERATIVE ULTRASOUND;  Surgeon: Claudene Penne ORN, MD;  Location: [facility] ORS;  Service: Neurosurgery;  Laterality: Right;   TUBAL LIGATION  2016    OB History   No obstetric history on file.      Home Medications    Prior to Admission medications   Medication Sig Start Date End Date Taking? Authorizing Provider  amoxicillin -clavulanate (AUGMENTIN ) 875-125 MG tablet Take 1 tablet by mouth every 12 (twelve) hours. 06/14/24  Yes Adylin Hankey R, NP  benzonatate  (TESSALON ) 100 MG capsule Take 1 capsule (100 mg total) by mouth every 8 (eight) hours. 06/09/24  Yes Jontez Redfield R, NP  guaiFENesin -codeine  100-10 MG/5ML syrup Take 5 mLs by mouth every 6 (six) hours as needed for cough. 06/09/24  Yes Teryn Boerema R, NP  predniSONE  (STERAPRED UNI-PAK 21 TAB) 10 MG (21) TBPK tablet Take by mouth daily. Take 6 tabs by mouth  daily  for 1 days, then 5 tabs for 1 days, then 4 tabs for 1 days, then 3 tabs for 1 days, 2 tabs for 1 days, then 1 tab by mouth daily for 1 days 06/09/24  Yes Reyonna Haack R, NP  celecoxib (CELEBREX) 200 MG capsule Take 200 mg by mouth 2 (two) times daily as needed for moderate pain (pain score 4-6). 01/14/23   [provider]  solifenacin (VESICARE) 10 MG tablet Take 10 mg by mouth daily.    [provider]  SUMAtriptan  (IMITREX) 100 MG tablet Take by mouth.    [provider]  tranexamic acid (LYSTEDA) 650 MG TABS tablet Take 1,300 mg by mouth 3 (three) times daily as needed (during menstrual cycle). 09/02/23   [provider]  Vitamin D, Ergocalciferol, (DRISDOL) 1.25 MG (50000 UNIT) CAPS capsule Take 50,000 Units by mouth once a week. 08/04/23   [provider]    Family History Family History  Family history unknown: Yes    Social History Social History   Tobacco Use   Smoking status: Never   Smokeless tobacco: Never  Vaping Use   Vaping status: Never Used  Substance Use Topics   Alcohol use: Yes    Comment: rarely wine   Drug use: Never     Allergies   Iodinated contrast media, Methocarbamol , and Other   Review of Systems Review of Systems   Physical Exam Triage Vital Signs ED Triage Vitals  Encounter Vitals Group     BP 06/09/24 0858 116/70     Girls Systolic BP Percentile --      Girls Diastolic BP Percentile --      Boys Systolic BP Percentile --      Boys Diastolic BP Percentile --      Pulse Rate 06/09/24 0858 84     Resp 06/09/24 0858 20     Temp 06/09/24 0858 99.3 F (37.4 C)     Temp Source 06/09/24 0858 Oral     SpO2 06/09/24 0858 99 %     Weight --      Height --      Head Circumference --      Peak Flow --      Pain Score 06/09/24 0901 7     Pain Loc --      Pain Education --      Exclude from Growth Chart --    No data found.  Updated Vital Signs BP 116/70 (BP Location: Left Arm)   Pulse 84   Temp 99.3 F (37.4 C) (Oral)   Resp 20   LMP 06/01/2024 (Exact Date)   SpO2 99%   Visual Acuity Right Eye Distance:   Left Eye Distance:   Bilateral Distance:    Right Eye Near:   Left Eye Near:    Bilateral Near:     Physical Exam Constitutional:      Appearance: Normal appearance.  HENT:     Right Ear: Tympanic membrane, ear canal and external ear normal.     Left Ear: Tympanic membrane, ear canal and external ear  normal.     Nose: Congestion present.     Mouth/Throat:     Mouth: Mucous membranes are moist.     Pharynx: Oropharynx is clear. No oropharyngeal exudate or posterior oropharyngeal erythema.  Eyes:     Extraocular Movements: Extraocular movements intact.  Cardiovascular:     Rate and Rhythm: Normal rate and regular rhythm.  Pulses: Normal pulses.     Heart sounds: Normal heart sounds.  Pulmonary:     Effort: Pulmonary effort is normal.     Breath sounds: Normal breath sounds.  Musculoskeletal:     Cervical back: Normal range of motion and neck supple.  Neurological:     Mental Status: She is alert and oriented to person, place, and time. Mental status is at baseline.      UC Treatments / Results  Labs (all labs ordered are listed, but only abnormal results are displayed) Labs Reviewed - No data to display  EKG   Radiology No results found.  Procedures Procedures (including critical care time)  Medications Ordered in UC Medications - No data to display  Initial Impression / Assessment and Plan / UC Course  I have reviewed the triage vital signs and the nursing notes.  Pertinent labs & imaging results that were available during my care of the patient were reviewed by me and considered in my medical decision making (see chart for details).  Viral URI with cough  Patient is in no signs of distress nor toxic appearing.  Vital signs are stable.  Low suspicion for pneumonia, pneumothorax or bronchitis and therefore will defer imaging.  Symptoms present for 3 days most likely viral in etiology, discussed this with patient, prescribed prednisone , Tessalon  and guaifenesin  codeine , PDMP reviewed, low risk, watch for any antibiotic placed at pharmacy if no improvement seen. May use additional over-the-counter medications as needed for supportive care.  May follow-up with urgent care as needed if symptoms persist or worsen.  Note given.   Final Clinical Impressions(s) / UC  Diagnoses   Final diagnoses:  Viral URI with cough     Discharge Instructions      Your symptoms today are most likely being caused by a virus and should steadily improve in time it can take up to 7 to 10 days before you truly start to see a turnaround however things will get better, if no improvement seen by Sunday may pick up antibiotic from the pharmacy  In the meantime take prednisone  every morning with food to open and relax the airway, should help with coughing, shortness of breath, chest soreness and sinus pressure, may take Tylenol  additionally  Use Tessalon  Perles every 8 hours for cough, may use cough syrup every 6 hours but be mindful this can make you feel drowsy    For cough: honey 1/2 to 1 teaspoon (you can dilute the honey in water or another fluid).  You can also use guaifenesin  and dextromethorphan for cough. You can use a humidifier for chest congestion and cough.  If you don't have a humidifier, you can sit in the bathroom with the hot shower running.      For sore throat: try warm salt water gargles, cepacol lozenges, throat spray, warm tea or water with lemon/honey, popsicles or ice, or OTC cold relief medicine for throat discomfort.   For congestion: take a daily anti-histamine like Zyrtec , Claritin, and a oral decongestant, such as pseudoephedrine.  You can also use Flonase  1-2 sprays in each nostril daily.   It is important to stay hydrated: drink plenty of fluids (water, gatorade/powerade/pedialyte, juices, or teas) to keep your throat moisturized and help further relieve irritation/discomfort.    ED Prescriptions     Medication Sig Dispense Auth. Provider   predniSONE  (STERAPRED UNI-PAK 21 TAB) 10 MG (21) TBPK tablet Take by mouth daily. Take 6 tabs by mouth daily  for 1 days,  then 5 tabs for 1 days, then 4 tabs for 1 days, then 3 tabs for 1 days, 2 tabs for 1 days, then 1 tab by mouth daily for 1 days 21 tablet Heaven Meeker R, NP   benzonatate  (TESSALON )  100 MG capsule Take 1 capsule (100 mg total) by mouth every 8 (eight) hours. 21 capsule Estephan Gallardo R, NP   guaiFENesin -codeine  100-10 MG/5ML syrup Take 5 mLs by mouth every 6 (six) hours as needed for cough. 120 mL Teresa Price R, NP   amoxicillin -clavulanate (AUGMENTIN ) 875-125 MG tablet Take 1 tablet by mouth every 12 (twelve) hours. 14 tablet Evelisse Szalkowski R, NP      I have reviewed the PDMP during this encounter.   Teresa Price [place], [place] 06/09/24 [phone]

## 2024-06-25 ENCOUNTER — Ambulatory Visit: Payer: PRIVATE HEALTH INSURANCE | Attending: Student | Admitting: Occupational Therapy

## 2024-06-25 ENCOUNTER — Encounter: Payer: Self-pay | Admitting: Occupational Therapy

## 2024-06-25 DIAGNOSIS — M25531 Pain in right wrist: Secondary | ICD-10-CM | POA: Diagnosis present

## 2024-06-25 DIAGNOSIS — M25631 Stiffness of right wrist, not elsewhere classified: Secondary | ICD-10-CM | POA: Insufficient documentation

## 2024-06-25 DIAGNOSIS — M79641 Pain in right hand: Secondary | ICD-10-CM | POA: Insufficient documentation

## 2024-06-25 DIAGNOSIS — L905 Scar conditions and fibrosis of skin: Secondary | ICD-10-CM | POA: Diagnosis present

## 2024-06-25 DIAGNOSIS — M6281 Muscle weakness (generalized): Secondary | ICD-10-CM | POA: Insufficient documentation

## 2024-06-25 DIAGNOSIS — M25521 Pain in right elbow: Secondary | ICD-10-CM | POA: Insufficient documentation

## 2024-06-25 NOTE — Therapy (Signed)
 OUTPATIENT OCCUPATIONAL THERAPY ORTHO EVALUATION  Patient Name: Valerie Pearson MRN: [id number] DOB:Apr 06, 1981, 43 y.o., female Today's Date: 06/25/2024  PCP: Dr Idelia REFERRING PROVIDER: Kip PA  END OF SESSION:  OT End of Session - 06/25/24 1714     Visit Number 1    Number of Visits 12    Date for OT Re-Evaluation 08/20/24    OT Start Time 1458    OT Stop Time 1544    OT Time Calculation (min) 46 min    Activity Tolerance Patient tolerated treatment well    Behavior During Therapy [facility] for tasks assessed/performed          Past Medical History:  Diagnosis Date   Anxiety    Cervical radiculopathy    Depression    GERD (gastroesophageal reflux disease)    Hypothyroidism    Iron deficiency anemia due to chronic blood loss    Migraines    Mild obstructive sleep apnea    Pre-diabetes    Right carpal tunnel syndrome 09/2023   Uterine fibroid    Uterine fibroid    Vitamin D insufficiency    Past Surgical History:  Procedure Laterality Date   CARPAL TUNNEL RELEASE Left 06/08/2020   Procedure: CARPAL TUNNEL RELEASE ENDOSCOPIC;  Surgeon: Edie Norleen PARAS, MD;  Location: [facility] ORS;  Service: Orthopedics;  Laterality: Left;   CARPAL TUNNEL RELEASE Right 10/29/2023   Procedure: RIGHT CARPAL TUNNEL RELEASE WITH ULTRASOUND GUIDANCE;  Surgeon: Claudene Penne ORN, MD;  Location: [facility] ORS;  Service: Neurosurgery;  Laterality: Right;   CERVICAL DISC ARTHROPLASTY N/A 10/29/2022   Procedure: C6-7 ARTHROPLASTY;  Surgeon: Clois Fret, MD;  Location: [facility] ORS;  Service: Neurosurgery;  Laterality: N/A;   CHOLECYSTECTOMY  2006   COLONOSCOPY  2013, 2018   COLONOSCOPY WITH PROPOFOL  N/A 11/13/2023   Procedure: COLONOSCOPY WITH PROPOFOL ;  Surgeon: Therisa Bi, MD;  Location: [facility] ENDOSCOPY;  Service: Gastroenterology;  Laterality: N/A;   DILATION AND CURETTAGE OF UTERUS  2014   ESOPHAGOGASTRODUODENOSCOPY  2018   ESOPHAGOGASTRODUODENOSCOPY  07/23/2022   OPERATIVE ULTRASOUND Right  10/29/2023   Procedure: OPERATIVE ULTRASOUND;  Surgeon: Claudene Penne ORN, MD;  Location: [facility] ORS;  Service: Neurosurgery;  Laterality: Right;   TUBAL LIGATION  2016   Patient Active Problem List   Diagnosis Date Noted   Pillar pain, post-operative 12/11/2023   Family history of colon cancer in father 11/13/2023   Bilateral carpal tunnel syndrome 09/25/2023   Right arm weakness 10/29/2022   Radicular syndrome of upper limbs 10/29/2022   Impaired fasting glucose 07/27/2022   Cervical radiculopathy 07/26/2022   Hypothyroidism    Depression     ONSET DATE: June 25  REFERRING DIAG: R lateral epicondylitis  THERAPY DIAG:  Pain in right elbow  Pain in right hand  Pain in right wrist  Scar tissue  Muscle weakness (generalized)  Stiffness of right wrist, not elsewhere classified  Rationale for Evaluation and Treatment: Rehabilitation  SUBJECTIVE:   SUBJECTIVE STATEMENT: I had last year a shot in my elbow and it hold for like 6 months.  But the 1 I had in June just held for a week.  My elbow hurts, my wrist hurts my thumb hurts.  I did had carpal tunnel release in December 24  and I had neck surgery  in end of  23 beginning 24 Pt accompanied by: self  PERTINENT HISTORY: Ortho visit 06/10/24:  Impression: Lateral epicondylitis of right elbow [M77.11] Lateral epicondylitis of right elbow (primary encounter diagnosis) Chronic elbow pain,  right  Plan:  1. Treatment options were discussed today with the patient. 2. The patient presents today for repeat evaluation of ongoing right lateral epicondylitis. 3. The patient does feel that the pain in the right elbow although it has returned is not as severe as it was prior to her injection. 4. she will continue with the etodolac twice daily. Tizanidine  as needed for muscle spasms. 5. She will proceed with formal occupational therapy at this time. 6. The patient will follow-up with me in 6 weeks for repeat evaluation, we will discuss  possible repeat injection versus potentially discussion of surgery.   PRECAUTIONS: None    WEIGHT BEARING RESTRICTIONS: No  PAIN:  Are you having pain?7-8/10 by elbow but also into my wrist and my hand  FALLS: Has patient fallen in last 6 months? 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  Also taking care of her mom that had hip fracture.  Watch TV and do her own housework  PATIENT GOALS: I want the pain better in my right arm and hand that I can feel better and do more things  NEXT MD VISIT: 6 weeks  OBJECTIVE:  Note: Objective measures were completed at Evaluation unless otherwise noted.  HAND DOMINANCE: Left  ADLs: Pain with all activities at work from stocking shelves to opening boxes to using box cutter to use doing cashier job as well as helping her mom with transfers and bathing her.  Also with cooking and cleaning and laundry  FUNCTIONAL OUTCOME MEASURES: Extension UPPER EXTREMITY ROM:     Active ROM Right eval Left eval  Shoulder flexion    Shoulder abduction    Shoulder adduction    Shoulder extension    Shoulder internal rotation    Shoulder external rotation    Elbow flexion    Elbow extension    Wrist flexion Pain    Wrist extension pain   Wrist ulnar deviation pain   Wrist radial deviation    Wrist pronation    Wrist supination    (Blank rows = not tested)  Active ROM Right eval Left eval  Thumb MCP (0-60)    Thumb IP (0-80)    Thumb Radial abd/add (0-55) 55 pain     Thumb Palmar abd/add (0-45) 55 pain     Thumb Opposition to Small Finger  Base of 5th -pain to 4th , 5th and DPC     Index MCP (0-90)     Index PIP (0-100)     Index DIP (0-70)      Long MCP (0-90)      Long PIP (0-100)      Long DIP (0-70)      Ring MCP (0-90)      Ring PIP (0-100)      Ring DIP (0-70)      Little MCP (0-90)      Little PIP  (0-100)      Little DIP (0-70)      (Blank rows = not tested)   HAND FUNCTION: Grip strength: Right: 27 extended arm 30  lbs; Left: 75 extended arm 45  lbs, Lateral pinch: Right: 14 lbs, Left: 16 lbs, and 3 point pinch: Right: 9 lbs, Left: 17 lbs  COORDINATION: Within functional limits with pain with thumb mobilization manipulating small objects palm to fingertips, pain with 3-point pinch and opening packages a  SENSATION:  No sensory issues mentioned but will rate continue to assess  EDEMA: Minimal  COGNITION: Overall cognitive status: Within functional limits for tasks assessed    TREATMENT DATE: 06/25/24                                                                                                                            Modalities: Heat   Time: 6 minutes  Location: Right elbow lateral epicondyle forearm and wrist Decrease pain and increase flexibility prior to review of home exercises and stretches   Patient educated in donning doffing and wearing of counterforce strap for elbow correctly.  Patient with less pain with wearing counterforce strap picking up 2 pound weight compared to no counterforce strap Patient fitted with a Benik neoprene wrap for the wrist to decrease pain at the wrist with functional use. But to wear at work as well as at home decreasing pain at wrist and elbow with functional activities that she can unmodified.    Patient do 2-3 times a day moist heat followed by cross friction massage to the lateral epicondyle in all directions Followed by elbow to side wrist composite flexion stretch and pronation as well as neutral 5 reps each 5-second hold  Reviewed and initiated modifications.   PATIENT EDUCATION: Education details: findings of eval and HEP  Person educated: Patient Education method: Explanation, Demonstration, Tactile cues, Verbal cues, and Handouts Education comprehension: verbalized understanding, returned demonstration, verbal cues  required, and needs further education    GOALS: Goals reviewed with patient? Yes    LONG TERM GOALS: Target date: 8 wks   Patient to be independent in home program performing modalities as well as correct braces and splints to decrease pain at rest to less than 2/10. Baseline: Pain 7-8/10 coming in today.  At elbow and wrist.  Wearing counterforce strap but not always helpful.-Patient with increased wrist pain and thumb pain and elbow pain. Goal status: INITIAL  2.  Patient is right thumb active range of motion and palmar radial abduction as well as opposition symptom-free to retrieve objects out of palm and opening small packages symptom-free Baseline: Patient pain with thumb opposition to 4th and 5th as well as distal palmar crease also pain with palmar radial abduction endrange on radial side into the palm.  Patient had carpal tunnel release in December 24. Goal status: INITIAL  3.  Right wrist pain improved for patient to perform active range of motion in all planes symptom-free to initiate strengthening Baseline: Pain with wrist flexion extension 6/10 mostly at volar wrist and over carpal tunnel scar -as well as pain with ulnar deviation. Goal status: INITIAL  4.  Pain at lateral epicondyle improved for patient to initiate strengthening protocol. Baseline: Pain 7-8/10 with tenderness lateral epicondyle and positive test for third digit extension as well as wrist extension with resistance Goal status: INITIAL  5.  Right grip strength improved to within normal range for age symptom-free for patient to perform ADLs  Baseline: Grip decreased right 27 pounds with extended arm 30 pounds, left 75 with extended arm 45.  Pain 7-8/10 at the lateral epicondyle and tenderness Goal status: INITIAL  6.  Patient verbalized and demonstrated implementing modifications and joint protection to decrease pain at lateral condyle as well as future flareups. Baseline: No knowledge about activity  modifications or activities that cause lateral condyle Goal status: INITIAL  ASSESSMENT:  CLINICAL IMPRESSION: Patient seen today for occupational therapy evaluation for right lateral epicondylitis.  Patient report had some symptoms last year and again earlier this year.  Shots was successful last year but not earlier this year.  Patient's pain 7-8/10 with tenderness of the lateral epicondyle.  Pt also with increased pain at the wrist as well as the thumb.  Patient had cervical surgery December 23.  Patient had a right carpal tunnel release December 24.  Patient works at Production designer, theatre/television/film at [employer].  That does a lot of physical activity.  Patient also takes care of her mom that needs physical assistance.  Patient with limited range of motion as well as strength in right hand -patient can benefit from skilled OT services to decrease pain and scar tissue increase motion and increase strength.  Patient was educated and reviewed correct application and use of counterforce strap as well as fitted with a neoprene wrist wrap to decrease pain with functional task while performing home exercises.  Initiated some education on modification and joint protection.  Patient can benefit from skilled OT services to decrease pain and scar tissue increase motion and strength to return to prior level of function.  PERFORMANCE DEFICITS: in functional skills including ADLs, IADLs, ROM, strength, pain, flexibility, decreased knowledge of use of DME, and UE functional use,   and psychosocial skills including environmental adaptation and routines and behaviors.   IMPAIRMENTS: are limiting patient from ADLs, IADLs, rest and sleep, play, leisure, and social participation.   COMORBIDITIES: has no other co-morbidities that affects occupational performance. Patient will benefit from skilled OT to address above impairments and improve overall function.  MODIFICATION OR ASSISTANCE TO COMPLETE EVALUATION: No modification of tasks  or assist necessary to complete an evaluation.  OT OCCUPATIONAL PROFILE AND HISTORY: Problem focused assessment: Including review of records relating to presenting problem.  CLINICAL DECISION MAKING: LOW - limited treatment options, no task modification necessary  REHAB POTENTIAL: Good for goals  EVALUATION COMPLEXITY: Low      PLAN:  OT FREQUENCY: 1-2x/week  OT DURATION: 6 weeks  PLANNED INTERVENTIONS: 97168 OT Re-evaluation, 97535 self care/ADL training, 02889 therapeutic exercise, 97530 therapeutic activity, 97112 neuromuscular re-education, 97140 manual therapy, 97035 ultrasound, 97010 moist heat, 97010 cryotherapy, 97034 contrast bath, 97033 iontophoresis, 97760 Orthotic Initial, 97763 Orthotic/Prosthetic subsequent, passive range of motion, patient/family education, and DME and/or AE instructions    CONSULTED AND AGREED WITH PLAN OF CARE: Patient     Ancel Peters, OTR/L,CLT 06/25/2024, 5:29 PM

## 2024-06-29 ENCOUNTER — Ambulatory Visit: Attending: Student | Admitting: Occupational Therapy

## 2024-06-29 DIAGNOSIS — M79641 Pain in right hand: Secondary | ICD-10-CM | POA: Insufficient documentation

## 2024-06-29 DIAGNOSIS — M25631 Stiffness of right wrist, not elsewhere classified: Secondary | ICD-10-CM | POA: Insufficient documentation

## 2024-06-29 DIAGNOSIS — M25521 Pain in right elbow: Secondary | ICD-10-CM | POA: Insufficient documentation

## 2024-06-29 DIAGNOSIS — L905 Scar conditions and fibrosis of skin: Secondary | ICD-10-CM | POA: Insufficient documentation

## 2024-06-29 DIAGNOSIS — M6281 Muscle weakness (generalized): Secondary | ICD-10-CM | POA: Insufficient documentation

## 2024-06-29 DIAGNOSIS — M25531 Pain in right wrist: Secondary | ICD-10-CM | POA: Insufficient documentation

## 2024-07-07 ENCOUNTER — Ambulatory Visit: Admitting: Occupational Therapy

## 2024-07-07 DIAGNOSIS — M25521 Pain in right elbow: Secondary | ICD-10-CM

## 2024-07-07 DIAGNOSIS — M79641 Pain in right hand: Secondary | ICD-10-CM | POA: Diagnosis present

## 2024-07-07 DIAGNOSIS — M25631 Stiffness of right wrist, not elsewhere classified: Secondary | ICD-10-CM | POA: Diagnosis present

## 2024-07-07 DIAGNOSIS — M25531 Pain in right wrist: Secondary | ICD-10-CM | POA: Diagnosis present

## 2024-07-07 DIAGNOSIS — L905 Scar conditions and fibrosis of skin: Secondary | ICD-10-CM | POA: Diagnosis present

## 2024-07-07 DIAGNOSIS — M6281 Muscle weakness (generalized): Secondary | ICD-10-CM | POA: Diagnosis present

## 2024-07-07 NOTE — Therapy (Signed)
 OUTPATIENT OCCUPATIONAL THERAPY ORTHO TREATMENT  Patient Name: Valerie Pearson MRN: [id number] DOB:06/19/1981, 43 y.o., female Today's Date: 07/07/2024  PCP: Dr Idelia REFERRING PROVIDER: Kip PA  END OF SESSION:  OT End of Session - 07/07/24 1620     Visit Number 2    Number of Visits 12    Date for OT Re-Evaluation 08/20/24    OT Start Time 1620    OT Stop Time 1715    OT Time Calculation (min) 55 min    Activity Tolerance Patient tolerated treatment well    Behavior During Therapy [facility] for tasks assessed/performed          Past Medical History:  Diagnosis Date   Anxiety    Cervical radiculopathy    Depression    GERD (gastroesophageal reflux disease)    Hypothyroidism    Iron deficiency anemia due to chronic blood loss    Migraines    Mild obstructive sleep apnea    Pre-diabetes    Right carpal tunnel syndrome 09/2023   Uterine fibroid    Uterine fibroid    Vitamin D insufficiency    Past Surgical History:  Procedure Laterality Date   CARPAL TUNNEL RELEASE Left 06/08/2020   Procedure: CARPAL TUNNEL RELEASE ENDOSCOPIC;  Surgeon: Edie Norleen PARAS, MD;  Location: [facility] ORS;  Service: Orthopedics;  Laterality: Left;   CARPAL TUNNEL RELEASE Right 10/29/2023   Procedure: RIGHT CARPAL TUNNEL RELEASE WITH ULTRASOUND GUIDANCE;  Surgeon: Claudene Penne ORN, MD;  Location: [facility] ORS;  Service: Neurosurgery;  Laterality: Right;   CERVICAL DISC ARTHROPLASTY N/A 10/29/2022   Procedure: C6-7 ARTHROPLASTY;  Surgeon: Clois Fret, MD;  Location: [facility] ORS;  Service: Neurosurgery;  Laterality: N/A;   CHOLECYSTECTOMY  2006   COLONOSCOPY  2013, 2018   COLONOSCOPY WITH PROPOFOL  N/A 11/13/2023   Procedure: COLONOSCOPY WITH PROPOFOL ;  Surgeon: Therisa Bi, MD;  Location: [facility] ENDOSCOPY;  Service: Gastroenterology;  Laterality: N/A;   DILATION AND CURETTAGE OF UTERUS  2014   ESOPHAGOGASTRODUODENOSCOPY  2018   ESOPHAGOGASTRODUODENOSCOPY  07/23/2022   OPERATIVE ULTRASOUND Right  10/29/2023   Procedure: OPERATIVE ULTRASOUND;  Surgeon: Claudene Penne ORN, MD;  Location: [facility] ORS;  Service: Neurosurgery;  Laterality: Right;   TUBAL LIGATION  2016   Patient Active Problem List   Diagnosis Date Noted   Pillar pain, post-operative 12/11/2023   Family history of colon cancer in father 11/13/2023   Bilateral carpal tunnel syndrome 09/25/2023   Right arm weakness 10/29/2022   Radicular syndrome of upper limbs 10/29/2022   Impaired fasting glucose 07/27/2022   Cervical radiculopathy 07/26/2022   Hypothyroidism    Depression     ONSET DATE: June 25  REFERRING DIAG: R lateral epicondylitis  THERAPY DIAG:  Pain in right elbow  Pain in right hand  Pain in right wrist  Scar tissue  Muscle weakness (generalized)  Stiffness of right wrist, not elsewhere classified  Rationale for Evaluation and Treatment: Rehabilitation  SUBJECTIVE:   SUBJECTIVE STATEMENT: Since I seen you about 2 weeks ago we traveled a lot with the service and the funeral.  And then doing the ashes at the [employer].  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  So my elbow and hand and the wrist are hurting Pt accompanied by: self  PERTINENT HISTORY: Ortho visit 06/10/24:  Impression: Lateral epicondylitis of right elbow [M77.11] Lateral epicondylitis  of right elbow (primary encounter diagnosis) Chronic elbow pain, right  Plan:  1. Treatment options were discussed today with the patient. 2. The patient presents today for repeat evaluation of ongoing right lateral epicondylitis. 3. The patient does feel that the pain in the right elbow although it has returned is not as severe as it was prior to her injection. 4. she will continue with the etodolac twice daily. Tizanidine  as needed for muscle spasms. 5. She will proceed with formal occupational therapy at this time. 6. The patient will  follow-up with me in 6 weeks for repeat evaluation, we will discuss possible repeat injection versus potentially discussion of surgery.   PRECAUTIONS: None    WEIGHT BEARING RESTRICTIONS: No  PAIN:  Are you having pain?4-5/10 by lat epicondyle and wrist/ palm   FALLS: Has patient fallen in last 6 months? 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  Also taking care of her mom that had hip fracture.  Watch TV and do her own housework  PATIENT GOALS: I want the pain better in my right arm and hand that I can feel better and do more things  NEXT MD VISIT: 6 weeks  OBJECTIVE:  Note: Objective measures were completed at Evaluation unless otherwise noted.  HAND DOMINANCE: Left  ADLs: Pain with all activities at work from stocking shelves to opening boxes to using box cutter to use doing cashier job as well as helping her mom with transfers and bathing her.  Also with cooking and cleaning and laundry  FUNCTIONAL OUTCOME MEASURES: Extension UPPER EXTREMITY ROM:     Active ROM Right eval Left eval  Shoulder flexion    Shoulder abduction    Shoulder adduction    Shoulder extension    Shoulder internal rotation    Shoulder external rotation    Elbow flexion    Elbow extension    Wrist flexion Pain    Wrist extension pain   Wrist ulnar deviation pain   Wrist radial deviation    Wrist pronation    Wrist supination    (Blank rows = not tested)  Active ROM Right eval Left eval  Thumb MCP (0-60)    Thumb IP (0-80)    Thumb Radial abd/add (0-55) 55 pain     Thumb Palmar abd/add (0-45) 55 pain     Thumb Opposition to Small Finger  Base of 5th -pain to 4th , 5th and DPC     Index MCP (0-90)     Index PIP (0-100)     Index DIP (0-70)      Long MCP (0-90)      Long PIP (0-100)      Long DIP (0-70)      Ring MCP (0-90)      Ring PIP (0-100)      Ring  DIP (0-70)      Little MCP (0-90)      Little PIP (0-100)      Little DIP (0-70)      (Blank rows = not tested)   HAND FUNCTION: Grip strength: Right: 27 extended arm 30  lbs; Left: 75 extended arm 45  lbs, Lateral pinch: Right: 14 lbs, Left: 16 lbs, and 3 point pinch: Right: 9 lbs, Left: 17 lbs  COORDINATION: Within functional limits with pain with thumb mobilization manipulating small objects palm to fingertips, pain with 3-point pinch  and opening packages a  SENSATION: No sensory issues mentioned but will rate continue to assess  EDEMA: Minimal  COGNITION: Overall cognitive status: Within functional limits for tasks assessed    TREATMENT DATE: 07/07/24                                                                                                                            Patient arrived after not being seen for about 12 days.  Patient had funeral and some services and traveled.  Patient also doing a lot of stocking of shelves and lifting and pushing and pulling things at the store.  Works from [place]. Patient reports still continues pain at the lateral upper condyle as well as in the wrist and palm/hand /5/10 with wrist flexion extension and radial ulnar deviation Also tenderness at the thumb CMC.  Supination pronation less pain with loose fist. Tenderness over the lateral epicondyle as well as pain with resistance to wrist extension and third digit extension with extended arm.  Paraffin done for 8 minutes to right hand and wrist to decrease pain and stiffness increase motion at the digits and wrist prior to manual therapy. Provide patient for information about paraffin for possibility at home.  Modalities: Heat at the same time than the paraffin Time: 6 minutes  Location: Right elbow lateral epicondyle forearm  Decrease pain and increase flexibility prior to review of home exercises and stretches  Done carpal and metacarpal spreads- a as well as soft tissue massage and  mobilization to the webspace in combination with thumb palmar radial abduction and wrist extension. Graston tool #2 done for sweeping over the volar forearm and hand.  Patient with multiply trigger points and tightness over the volar forearm. Done some dry cupping 5 cm cups done 2 at a time over the volar flexors proximal and distal with a light pressure and static with AAROM wrist flexion extension 10 reps.  Repeated 3 times Repeated also same cupping techniques size over the proximal volar flexors of the forearm and palm with AAROM wrist flexion extension and digits flexion and extension 10 repetitions repeated 2 times Pain decreased from a 4-5/10 to 1/10 with active range of motion of hand and digits. As well as less trigger points and fibrosis in the forearm. Add for patient for home program soft tissue mobilization over right roller for volar forearm wrist and palm to decrease trigger points and increased blood flow prior to range of motion    Patient educated in donning doffing and wearing of counterforce strap for elbow correctly at evaluation.  Patient with less pain with wearing counterforce strap picking up 2 pound weight compared to no counterforce strap Patient to continue with Benik neoprene wrap for the wrist to decrease pain at the wrist with functional use. But to wear at work as well as at home decreasing pain at wrist and elbow with functional activities that she can unmodified.    Patient do 2-3 times a  day moist heat followed by cross friction massage to the lateral epicondyle in all directions Followed by elbow to side wrist composite flexion stretch and pronation as well as neutral 5 reps each 5-second hold  Iontophoresis with dexamethasone  done with a medium patch on right lateral epicondyle at 2.0 current for 19 minutes.  Patient tolerated well.  Patient was educated in iontophoresis use and precautions and protocol. Removed patch and did a skin check prior and afterwards.   No issues.  Reviewed and initiated modifications at evaluation.   PATIENT EDUCATION: Education details: findings of eval and HEP  Person educated: Patient Education method: Explanation, Demonstration, Tactile cues, Verbal cues, and Handouts Education comprehension: verbalized understanding, returned demonstration, verbal cues required, and needs further education    GOALS: Goals reviewed with patient? Yes    LONG TERM GOALS: Target date: 8 wks   Patient to be independent in home program performing modalities as well as correct braces and splints to decrease pain at rest to less than 2/10. Baseline: Pain 7-8/10 coming in today.  At elbow and wrist.  Wearing counterforce strap but not always helpful.-Patient with increased wrist pain and thumb pain and elbow pain. Goal status: INITIAL  2.  Patient is right thumb active range of motion and palmar radial abduction as well as opposition symptom-free to retrieve objects out of palm and opening small packages symptom-free Baseline: Patient pain with thumb opposition to 4th and 5th as well as distal palmar crease also pain with palmar radial abduction endrange on radial side into the palm.  Patient had carpal tunnel release in December 24. Goal status: INITIAL  3.  Right wrist pain improved for patient to perform active range of motion in all planes symptom-free to initiate strengthening Baseline: Pain with wrist flexion extension 6/10 mostly at volar wrist and over carpal tunnel scar -as well as pain with ulnar deviation. Goal status: INITIAL  4.  Pain at lateral epicondyle improved for patient to initiate strengthening protocol. Baseline: Pain 7-8/10 with tenderness lateral epicondyle and positive test for third digit extension as well as wrist extension with resistance Goal status: INITIAL  5.  Right grip strength improved to within normal range for age symptom-free for patient to perform ADLs  Baseline: Grip decreased right 27 pounds  with extended arm 30 pounds, left 75 with extended arm 45.  Pain 7-8/10 at the lateral epicondyle and tenderness Goal status: INITIAL  6.  Patient verbalized and demonstrated implementing modifications and joint protection to decrease pain at lateral condyle as well as future flareups. Baseline: No knowledge about activity modifications or activities that cause lateral condyle Goal status: INITIAL  ASSESSMENT:  CLINICAL IMPRESSION: Patient seen today for occupational therapy for right lateral epicondylitis.  Patient report had some symptoms last year and again earlier this year.  Shots was successful last year but not earlier this year.  Patient's pain 7-8/10 with tenderness of the lateral epicondyle.  Pt also with increased pain at the wrist as well as the thumb.  Patient had cervical surgery December 23.  Patient had a right carpal tunnel release December 24.  Patient works at Production designer, theatre/television/film at [employer].  That does a lot of physical activity.  Patient also takes care of her mom that needs physical assistance.  Patient arrived today after not being seen for about 2 weeks.  With increased pain still at the lateral epicondyle and wrist and hand.  5/10.  Patient with increased tightness and trigger points in the volar hand  and forearm.  Patient responded greatly to paraffin as well as several types of manual therapy.  In combination with range of motion.  Patient with increased pain and limited range of motion as well as strength in right hand -patient can benefit from skilled OT services to decrease pain and scar tissue increase motion and increase strength.  Patient was at evaluation educated and reviewed correct application and use of counterforce strap as well as fitted with a neoprene wrist wrap to decrease pain with functional task while performing home exercises.  Initiated some education on modification and joint protection.  Patient can benefit from skilled OT services to decrease pain and scar  tissue increase motion and strength to return to prior level of function.  PERFORMANCE DEFICITS: in functional skills including ADLs, IADLs, ROM, strength, pain, flexibility, decreased knowledge of use of DME, and UE functional use,   and psychosocial skills including environmental adaptation and routines and behaviors.   IMPAIRMENTS: are limiting patient from ADLs, IADLs, rest and sleep, play, leisure, and social participation.   COMORBIDITIES: has no other co-morbidities that affects occupational performance. Patient will benefit from skilled OT to address above impairments and improve overall function.  MODIFICATION OR ASSISTANCE TO COMPLETE EVALUATION: No modification of tasks or assist necessary to complete an evaluation.  OT OCCUPATIONAL PROFILE AND HISTORY: Problem focused assessment: Including review of records relating to presenting problem.  CLINICAL DECISION MAKING: LOW - limited treatment options, no task modification necessary  REHAB POTENTIAL: Good for goals  EVALUATION COMPLEXITY: Low      PLAN:  OT FREQUENCY: 1-2x/week  OT DURATION: 6 weeks  PLANNED INTERVENTIONS: 97168 OT Re-evaluation, 97535 self care/ADL training, 02889 therapeutic exercise, 97530 therapeutic activity, 97112 neuromuscular re-education, 97140 manual therapy, 97035 ultrasound, 97010 moist heat, 97010 cryotherapy, 97034 contrast bath, 97033 iontophoresis, 97760 Orthotic Initial, 97763 Orthotic/Prosthetic subsequent, passive range of motion, patient/family education, and DME and/or AE instructions    CONSULTED AND AGREED WITH PLAN OF CARE: Patient     Ancel Peters, OTR/L,CLT 07/07/2024, 5:25 PM

## 2024-07-09 ENCOUNTER — Ambulatory Visit: Payer: PRIVATE HEALTH INSURANCE | Admitting: Occupational Therapy

## 2024-07-09 DIAGNOSIS — L905 Scar conditions and fibrosis of skin: Secondary | ICD-10-CM

## 2024-07-09 DIAGNOSIS — M6281 Muscle weakness (generalized): Secondary | ICD-10-CM

## 2024-07-09 DIAGNOSIS — M79641 Pain in right hand: Secondary | ICD-10-CM

## 2024-07-09 DIAGNOSIS — M25631 Stiffness of right wrist, not elsewhere classified: Secondary | ICD-10-CM

## 2024-07-09 DIAGNOSIS — M25531 Pain in right wrist: Secondary | ICD-10-CM

## 2024-07-09 DIAGNOSIS — M25521 Pain in right elbow: Secondary | ICD-10-CM | POA: Diagnosis not present

## 2024-07-14 ENCOUNTER — Ambulatory Visit: Payer: PRIVATE HEALTH INSURANCE | Admitting: Occupational Therapy

## 2024-07-14 ENCOUNTER — Encounter: Payer: Self-pay | Admitting: Occupational Therapy

## 2024-07-14 DIAGNOSIS — M25521 Pain in right elbow: Secondary | ICD-10-CM | POA: Diagnosis not present

## 2024-07-14 DIAGNOSIS — M6281 Muscle weakness (generalized): Secondary | ICD-10-CM

## 2024-07-14 DIAGNOSIS — M79641 Pain in right hand: Secondary | ICD-10-CM

## 2024-07-14 DIAGNOSIS — M25631 Stiffness of right wrist, not elsewhere classified: Secondary | ICD-10-CM

## 2024-07-14 DIAGNOSIS — M25531 Pain in right wrist: Secondary | ICD-10-CM

## 2024-07-14 DIAGNOSIS — L905 Scar conditions and fibrosis of skin: Secondary | ICD-10-CM

## 2024-07-14 NOTE — Therapy (Signed)
 OUTPATIENT OCCUPATIONAL THERAPY ORTHO TREATMENT  Patient Name: Valerie Pearson MRN: [id number] DOB:04/12/1981, 43 y.o., female Today's Date: 07/14/2024  PCP: Dr Idelia REFERRING PROVIDER: Kip PA  END OF SESSION:  OT End of Session - 07/14/24 1531     Visit Number 4    Number of Visits 12    Date for OT Re-Evaluation 08/20/24    OT Start Time 1530    OT Stop Time 1620    OT Time Calculation (min) 50 min    Activity Tolerance Patient tolerated treatment well    Behavior During Therapy [facility] for tasks assessed/performed          Past Medical History:  Diagnosis Date   Anxiety    Cervical radiculopathy    Depression    GERD (gastroesophageal reflux disease)    Hypothyroidism    Iron deficiency anemia due to chronic blood loss    Migraines    Mild obstructive sleep apnea    Pre-diabetes    Right carpal tunnel syndrome 09/2023   Uterine fibroid    Uterine fibroid    Vitamin D insufficiency    Past Surgical History:  Procedure Laterality Date   CARPAL TUNNEL RELEASE Left 06/08/2020   Procedure: CARPAL TUNNEL RELEASE ENDOSCOPIC;  Surgeon: Edie Norleen PARAS, MD;  Location: [facility] ORS;  Service: Orthopedics;  Laterality: Left;   CARPAL TUNNEL RELEASE Right 10/29/2023   Procedure: RIGHT CARPAL TUNNEL RELEASE WITH ULTRASOUND GUIDANCE;  Surgeon: Claudene Penne ORN, MD;  Location: [facility] ORS;  Service: Neurosurgery;  Laterality: Right;   CERVICAL DISC ARTHROPLASTY N/A 10/29/2022   Procedure: C6-7 ARTHROPLASTY;  Surgeon: Clois Fret, MD;  Location: [facility] ORS;  Service: Neurosurgery;  Laterality: N/A;   CHOLECYSTECTOMY  2006   COLONOSCOPY  2013, 2018   COLONOSCOPY WITH PROPOFOL  N/A 11/13/2023   Procedure: COLONOSCOPY WITH PROPOFOL ;  Surgeon: Therisa Bi, MD;  Location: [facility] ENDOSCOPY;  Service: Gastroenterology;  Laterality: N/A;   DILATION AND CURETTAGE OF UTERUS  2014   ESOPHAGOGASTRODUODENOSCOPY  2018   ESOPHAGOGASTRODUODENOSCOPY  07/23/2022   OPERATIVE ULTRASOUND Right  10/29/2023   Procedure: OPERATIVE ULTRASOUND;  Surgeon: Claudene Penne ORN, MD;  Location: [facility] ORS;  Service: Neurosurgery;  Laterality: Right;   TUBAL LIGATION  2016   Patient Active Problem List   Diagnosis Date Noted   Pillar pain, post-operative 12/11/2023   Family history of colon cancer in father 11/13/2023   Bilateral carpal tunnel syndrome 09/25/2023   Right arm weakness 10/29/2022   Radicular syndrome of upper limbs 10/29/2022   Impaired fasting glucose 07/27/2022   Cervical radiculopathy 07/26/2022   Hypothyroidism    Depression     ONSET DATE: June 25  REFERRING DIAG: R lateral epicondylitis  THERAPY DIAG:  Pain in right elbow  Pain in right hand  Pain in right wrist  Scar tissue  Muscle weakness (generalized)  Stiffness of right wrist, not elsewhere classified  Rationale for Evaluation and Treatment: Rehabilitation  SUBJECTIVE:   SUBJECTIVE STATEMENT: I did not work last night.  Or today so is feeling actually okay.  If I work I keep my wrist brace on and the strap at my elbow.  My pain is probably a 6 or 7.  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  But it is in my hand, thumb, forearm to the elbow  Pt accompanied by: self  PERTINENT HISTORY: Ortho visit 06/10/24:  Impression: Lateral epicondylitis of right elbow [M77.11] Lateral epicondylitis  of right elbow (primary encounter diagnosis) Chronic elbow pain, right  Plan:  1. Treatment options were discussed today with the patient. 2. The patient presents today for repeat evaluation of ongoing right lateral epicondylitis. 3. The patient does feel that the pain in the right elbow although it has returned is not as severe as it was prior to her injection. 4. she will continue with the etodolac twice daily. Tizanidine  as needed for muscle spasms. 5. She will proceed with formal occupational therapy at this time. 6. The patient will follow-up with me in 6  weeks for repeat evaluation, we will discuss possible repeat injection versus potentially discussion of surgery.   PRECAUTIONS: None    WEIGHT BEARING RESTRICTIONS: No  PAIN:  Are you having pain?  No pain coming in today but can increase with activity 6/10  FALLS: Has patient fallen in last 6 months? 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  Also taking care of her mom that had hip fracture.  Watch TV and do her own housework  PATIENT GOALS: I want the pain better in my right arm and hand that I can feel better and do more things  NEXT MD VISIT: 6 weeks  OBJECTIVE:  Note: Objective measures were completed at Evaluation unless otherwise noted.  HAND DOMINANCE: Left  ADLs: Pain with all activities at work from stocking shelves to opening boxes to using box cutter to use doing cashier job as well as helping her mom with transfers and bathing her.  Also with cooking and cleaning and laundry  FUNCTIONAL OUTCOME MEASURES: Extension UPPER EXTREMITY ROM:     Active ROM Right eval Left eval  Shoulder flexion    Shoulder abduction    Shoulder adduction    Shoulder extension    Shoulder internal rotation    Shoulder external rotation    Elbow flexion    Elbow extension    Wrist flexion Pain    Wrist extension pain   Wrist ulnar deviation pain   Wrist radial deviation    Wrist pronation    Wrist supination    (Blank rows = not tested)  Active ROM Right eval Left eval  Thumb MCP (0-60)    Thumb IP (0-80)    Thumb Radial abd/add (0-55) 55 pain     Thumb Palmar abd/add (0-45) 55 pain     Thumb Opposition to Small Finger  Base of 5th -pain to 4th , 5th and DPC     Index MCP (0-90)     Index PIP (0-100)     Index DIP (0-70)      Long MCP (0-90)      Long PIP (0-100)      Long DIP (0-70)      Ring MCP (0-90)      Ring PIP (0-100)      Ring  DIP (0-70)      Little MCP (0-90)      Little PIP (0-100)      Little DIP (0-70)      (Blank rows = not tested)   HAND FUNCTION: Grip strength: Right: 27 extended arm 30  lbs; Left: 75 extended arm 45  lbs, Lateral pinch: Right: 14 lbs, Left: 16 lbs, and 3 point pinch: Right: 9 lbs, Left: 17 lbs 07/14/24 Grip strength: Right: 40 extended arm 30  lbs; Left: 55 extended arm 50  lbs, Lateral pinch: Right: 14 lbs, Left: 16 lbs, and 3 point pinch: Right: 9 lbs, Left: 17 lbs  COORDINATION: Within functional limits with pain with thumb mobilization manipulating small objects palm to fingertips, pain with 3-point pinch and opening packages a  SENSATION: No sensory issues mentioned but will rate continue to assess  EDEMA: Minimal  COGNITION: Overall cognitive status: Within functional limits for tasks assessed    TREATMENT DATE: 07/14/24    She denies any pain at rest but reports up to 6/10 pain at times with use at the lateral epicondyle and wrist.                                                                                                                          Paraffin:  Paraffin applied to right hand and wrist  to decrease pain, stiffness and to increase motion at the digits and wrist prior to manual therapy. Pt reports positive results with paraffin and is looking into purchasing a unit for home.   Heat at the same time with the paraffin Time: 6 minutes  Location: Right elbow lateral epicondyle forearm  Decrease pain and increase flexibility prior to review of home exercises and stretches   Manual Therapy: Following paraffin, pt was seen for manual therapy with emphasis on carpal and metacarpal spreads, soft tissue massage and mobilization to the webspace in combination with thumb palmar radial abduction and wrist extension.  Graston tool #2 utilized for sweeping over the volar forearm and hand.  Therapeutic Exercises Performed dry cupping 5 cm cups -2 cups on flexors proximal  and distal with a light to medium pressure and static with AAROM wrist flexion extension 10 reps.  Repeated also same cupping techniques size over the proximal volar flexors of the forearm and palm with AAROM wrist flexion extension and digits flexion and extension 10 reps. Repeated 3 times with the placement of proximal cup mid forearm third time  Patient to continue with home program of soft tissue mobilization over right roller for volar forearm wrist and palm to decrease trigger points and increased blood flow prior to range of motion Continued use of counterforce brace, benik neoprene wrap with thumb piece for the wrist  Recommend pt continue to analyze work tasks and find alternative ways to modify tasks to decrease activity demands on her UE.  Patient to apply moist heat 2-3 times a day, followed by cross friction massage to the lateral epicondyle in all directions Followed by elbow to side wrist composite flexion stretch and pronation as well as neutral 5 reps each 5-second hold Can also do extended arm support open hand wrist flexion in pronation 5 reps hold 5 seconds  Iontophoresis with dexamethasone  performed with a medium patch on right lateral epicondyle,  1.8 intensity at 20 minutes .  Patient was educated in iontophoresis use and precautions and protocol. Skin check done prior to session.  Tolerated well today.  Patient to keep patch on for hour after leaving  PATIENT EDUCATION: Education details: findings of eval and HEP  Person educated: Patient Education method: Explanation, Demonstration, Tactile cues, Verbal cues, and Handouts Education comprehension: verbalized understanding, returned demonstration, verbal cues required, and needs further education    GOALS: Goals reviewed with patient? Yes    LONG TERM GOALS: Target date: 8 wks   Patient to be independent in home program performing modalities as well as correct braces and splints to decrease pain at rest to  less than 2/10. Baseline: Pain 7-8/10 coming in today.  At elbow and wrist.  Wearing counterforce strap but not always helpful.-Patient with increased wrist pain and thumb pain and elbow pain. Goal status: INITIAL  2.  Patient is right thumb active range of motion and palmar radial abduction as well as opposition symptom-free to retrieve objects out of palm and opening small packages symptom-free Baseline: Patient pain with thumb opposition to 4th and 5th as well as distal palmar crease also pain with palmar radial abduction endrange on radial side into the palm.  Patient had carpal tunnel release in December 24. Goal status: INITIAL  3.  Right wrist pain improved for patient to perform active range of motion in all planes symptom-free to initiate strengthening Baseline: Pain with wrist flexion extension 6/10 mostly at volar wrist and over carpal tunnel scar -as well as pain with ulnar deviation. Goal status: INITIAL  4.  Pain at lateral epicondyle improved for patient to initiate strengthening protocol. Baseline: Pain 7-8/10 with tenderness lateral epicondyle and positive test for third digit extension as well as wrist extension with resistance Goal status: INITIAL  5.  Right grip strength improved to within normal range for age symptom-free for patient to perform ADLs  Baseline: Grip decreased right 27 pounds with extended arm 30 pounds, left 75 with extended arm 45.  Pain 7-8/10 at the lateral epicondyle and tenderness Goal status: INITIAL  6.  Patient verbalized and demonstrated implementing modifications and joint protection to decrease pain at lateral condyle as well as future flareups. Baseline: No knowledge about activity modifications or activities that cause lateral condyle Goal status: INITIAL  ASSESSMENT:  CLINICAL IMPRESSION: Patient seen for occupational therapy for right lateral epicondylitis.  Patient report had some symptoms last year and again earlier this year.  Shots  was successful last year but not earlier this year.  Patient's pain 7-8/10 with tenderness of the lateral epicondyle.  Pt also with increased pain at the wrist as well as the thumb.  Patient had cervical surgery December 23.  Patient had a right carpal tunnel release December 24.  Patient works at Production designer, theatre/television/film at [employer].  That does a lot of physical activity.  Patient also takes care of her mom that needs physical assistance.  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  She continues to demonstrate increased tightness and trigger points in the volar hand and forearm but responding well to cupping and use of Graston tools.  Patient continues to respond well to paraffin. Patient with no pain reported at rest but has up to 6/10 pain with continued use.  Patient feels like his pain is not as intense.  And grip increased see flowsheet.  She would benefit from additional activity modification to work tasks to decrease activity demands and decrease pain.   Patient can benefit from skilled OT services to decrease pain and scar tissue increase motion and strength to return to prior level of function.  PERFORMANCE  DEFICITS: in functional skills including ADLs, IADLs, ROM, strength, pain, flexibility, decreased knowledge of use of DME, and UE functional use,   and psychosocial skills including environmental adaptation and routines and behaviors.   IMPAIRMENTS: are limiting patient from ADLs, IADLs, rest and sleep, play, leisure, and social participation.   COMORBIDITIES: has no other co-morbidities that affects occupational performance. Patient will benefit from skilled OT to address above impairments and improve overall function.  MODIFICATION OR ASSISTANCE TO COMPLETE EVALUATION: No modification of tasks or assist necessary to complete an evaluation.  OT OCCUPATIONAL PROFILE AND HISTORY: Problem focused assessment: Including review of  records relating to presenting problem.  CLINICAL DECISION MAKING: LOW - limited treatment options, no task modification necessary  REHAB POTENTIAL: Good for goals  EVALUATION COMPLEXITY: Low  PLAN:  OT FREQUENCY: 1-2x/week  OT DURATION: 6 weeks  PLANNED INTERVENTIONS: 97168 OT Re-evaluation, 97535 self care/ADL training, 02889 therapeutic exercise, 97530 therapeutic activity, 97112 neuromuscular re-education, 97140 manual therapy, 97035 ultrasound, 97010 moist heat, 97010 cryotherapy, 97034 contrast bath, 97033 iontophoresis, 97760 Orthotic Initial, 97763 Orthotic/Prosthetic subsequent, passive range of motion, patient/family education, and DME and/or AE instructions  CONSULTED AND AGREED WITH PLAN OF CARE: Patient   Ancel Peters, OTR/L,CLT 07/14/2024, 5:18 PM

## 2024-07-14 NOTE — Therapy (Signed)
 OUTPATIENT OCCUPATIONAL THERAPY ORTHO TREATMENT  Patient Name: Valerie Pearson MRN: [id number] DOB:[birth year], 43 y.o., female Today's Date: 07/14/2024  PCP: Dr Idelia REFERRING PROVIDER: Kip PA  END OF SESSION:  OT End of Session - 07/14/24 1407     Visit Number 3    Number of Visits 12    Date for OT Re-Evaluation 08/20/24    OT Start Time 1537    OT Stop Time 1619    OT Time Calculation (min) 42 min    Activity Tolerance Patient tolerated treatment well    Behavior During Therapy [facility] for tasks assessed/performed          Past Medical History:  Diagnosis Date   Anxiety    Cervical radiculopathy    Depression    GERD (gastroesophageal reflux disease)    Hypothyroidism    Iron deficiency anemia due to chronic blood loss    Migraines    Mild obstructive sleep apnea    Pre-diabetes    Right carpal tunnel syndrome 09/2023   Uterine fibroid    Uterine fibroid    Vitamin D insufficiency    Past Surgical History:  Procedure Laterality Date   CARPAL TUNNEL RELEASE Left 06/08/2020   Procedure: CARPAL TUNNEL RELEASE ENDOSCOPIC;  Surgeon: Edie Norleen PARAS, MD;  Location: [facility] ORS;  Service: Orthopedics;  Laterality: Left;   CARPAL TUNNEL RELEASE Right 10/29/2023   Procedure: RIGHT CARPAL TUNNEL RELEASE WITH ULTRASOUND GUIDANCE;  Surgeon: Claudene Penne ORN, MD;  Location: [facility] ORS;  Service: Neurosurgery;  Laterality: Right;   CERVICAL DISC ARTHROPLASTY N/A 10/29/2022   Procedure: C6-7 ARTHROPLASTY;  Surgeon: Clois Fret, MD;  Location: [facility] ORS;  Service: Neurosurgery;  Laterality: N/A;   CHOLECYSTECTOMY  2006   COLONOSCOPY  2013, 2018   COLONOSCOPY WITH PROPOFOL  N/A 11/13/2023   Procedure: COLONOSCOPY WITH PROPOFOL ;  Surgeon: Therisa Bi, MD;  Location: [facility] ENDOSCOPY;  Service: Gastroenterology;  Laterality: N/A;   DILATION AND CURETTAGE OF UTERUS  2014   ESOPHAGOGASTRODUODENOSCOPY  2018   ESOPHAGOGASTRODUODENOSCOPY  07/23/2022   OPERATIVE ULTRASOUND Right  10/29/2023   Procedure: OPERATIVE ULTRASOUND;  Surgeon: Claudene Penne ORN, MD;  Location: [facility] ORS;  Service: Neurosurgery;  Laterality: Right;   TUBAL LIGATION  2016   Patient Active Problem List   Diagnosis Date Noted   Pillar pain, post-operative 12/11/2023   Family history of colon cancer in father 11/13/2023   Bilateral carpal tunnel syndrome 09/25/2023   Right arm weakness 10/29/2022   Radicular syndrome of upper limbs 10/29/2022   Impaired fasting glucose 07/27/2022   Cervical radiculopathy 07/26/2022   Hypothyroidism    Depression     ONSET DATE: June 25  REFERRING DIAG: R lateral epicondylitis  THERAPY DIAG:  Pain in right elbow  Pain in right hand  Pain in right wrist  Scar tissue  Muscle weakness (generalized)  Stiffness of right wrist, not elsewhere classified  Rationale for Evaluation and Treatment: Rehabilitation  SUBJECTIVE:   SUBJECTIVE STATEMENT: See below for subjective in treatment note  Pt accompanied by: self  PERTINENT HISTORY: Ortho visit 06/10/24:  Impression: Lateral epicondylitis of right elbow [M77.11] Lateral epicondylitis of right elbow (primary encounter diagnosis) Chronic elbow pain, right  Plan:  1. Treatment options were discussed today with the patient. 2. The patient presents today for repeat evaluation of ongoing right lateral epicondylitis. 3. The patient does feel that the pain in the right elbow although it has returned is not as severe as it was prior to her injection. 4. she  will continue with the etodolac twice daily. Tizanidine  as needed for muscle spasms. 5. She will proceed with formal occupational therapy at this time. 6. The patient will follow-up with me in 6 weeks for repeat evaluation, we will discuss possible repeat injection versus potentially discussion of surgery.   PRECAUTIONS: None    WEIGHT BEARING RESTRICTIONS: No  PAIN:  Are you having pain?4-5/10 by lat epicondyle and wrist/ palm   FALLS: Has  patient fallen in last 6 months? 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  Also taking care of her mom that had hip fracture.  Watch TV and do her own housework  PATIENT GOALS: I want the pain better in my right arm and hand that I can feel better and do more things  NEXT MD VISIT: 6 weeks  OBJECTIVE:  Note: Objective measures were completed at Evaluation unless otherwise noted.  HAND DOMINANCE: Left  ADLs: Pain with all activities at work from stocking shelves to opening boxes to using box cutter to use doing cashier job as well as helping her mom with transfers and bathing her.  Also with cooking and cleaning and laundry  FUNCTIONAL OUTCOME MEASURES: Extension UPPER EXTREMITY ROM:     Active ROM Right eval Left eval  Shoulder flexion    Shoulder abduction    Shoulder adduction    Shoulder extension    Shoulder internal rotation    Shoulder external rotation    Elbow flexion    Elbow extension    Wrist flexion Pain    Wrist extension pain   Wrist ulnar deviation pain   Wrist radial deviation    Wrist pronation    Wrist supination    (Blank rows = not tested)  Active ROM Right eval Left eval  Thumb MCP (0-60)    Thumb IP (0-80)    Thumb Radial abd/add (0-55) 55 pain     Thumb Palmar abd/add (0-45) 55 pain     Thumb Opposition to Small Finger  Base of 5th -pain to 4th , 5th and DPC     Index MCP (0-90)     Index PIP (0-100)     Index DIP (0-70)      Long MCP (0-90)      Long PIP (0-100)      Long DIP (0-70)      Ring MCP (0-90)      Ring PIP (0-100)      Ring DIP (0-70)      Little MCP (0-90)      Little PIP (0-100)      Little DIP (0-70)      (Blank rows = not tested)   HAND FUNCTION: Grip strength: Right: 27 extended arm 30  lbs; Left: 75 extended arm 45  lbs, Lateral pinch: Right: 14 lbs, Left: 16 lbs, and 3 point pinch:  Right: 9 lbs, Left: 17 lbs  COORDINATION: Within functional limits with pain with thumb mobilization manipulating small objects palm to fingertips, pain with 3-point pinch and opening packages a  SENSATION: No sensory issues mentioned but will rate continue to assess  EDEMA: Minimal  COGNITION: Overall cognitive status: Within functional limits for tasks assessed    TREATMENT DATE: 07/09/24    Pt reports she 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  She denies any pain at rest  but reports up to 7/10 pain at times with use at the lateral epicondyle and wrist.                                                                                                                          Paraffin:  Paraffin applied to right hand and wrist  to decrease pain, stiffness and to increase motion at the digits and wrist prior to manual therapy. Pt reports positive results with paraffin and is looking into purchasing a unit for home.   Heat at the same time with the paraffin Time: 6 minutes  Location: Right elbow lateral epicondyle forearm  Decrease pain and increase flexibility prior to review of home exercises and stretches   Manual Therapy: Following paraffin, pt was seen for manual therapy with emphasis on carpal and metacarpal spreads, soft tissue massage and mobilization to the webspace in combination with thumb palmar radial abduction and wrist extension.  Graston tool #2 utilized for sweeping over the volar forearm and hand.  Therapeutic Exercises Performed dry cupping 5 cm cups one at a time over the volar flexors proximal and distal with a light pressure and static with AAROM wrist flexion extension 10 reps.  Repeated also same cupping techniques size over the proximal volar flexors of the forearm and palm with AAROM wrist flexion extension and digits flexion and extension 5 reps.  Patient to continue with home program of soft tissue  mobilization over right roller for volar forearm wrist and palm to decrease trigger points and increased blood flow prior to range of motion Continued use of counterforce brace, benik neoprene wrap for the wrist  Recommend pt continue to analyze work tasks and find alternative ways to modify tasks to decrease activity demands on her UE.  Patient to apply moist heat 2-3 times a day, followed by cross friction massage to the lateral epicondyle in all directions Followed by elbow to side wrist composite flexion stretch and pronation as well as neutral 5 reps each 5-second hold  Iontophoresis with dexamethasone  performed with a medium patch on right lateral epicondyle, intensity from 1.0 to 2.0 with some difficulty tolerating ionto this date.  Patient was educated in iontophoresis use and precautions and protocol. Skin intact prior to application and kept patch on for one hour after session.       PATIENT EDUCATION: Education details: findings of eval and HEP  Person educated: Patient Education method: Explanation, Demonstration, Tactile cues, Verbal cues, and Handouts Education comprehension: verbalized understanding, returned demonstration, verbal cues required, and needs further education    GOALS: Goals reviewed with patient? Yes    LONG TERM GOALS: Target date: 8 wks   Patient to be independent in home program performing modalities as well as correct braces and splints to decrease pain at rest to less than 2/10. Baseline: Pain 7-8/10 coming in today.  At elbow and wrist.  Wearing counterforce strap but not always helpful.-Patient with increased wrist pain and thumb pain and elbow  pain. Goal status: INITIAL  2.  Patient is right thumb active range of motion and palmar radial abduction as well as opposition symptom-free to retrieve objects out of palm and opening small packages symptom-free Baseline: Patient pain with thumb opposition to 4th and 5th as well as distal palmar crease also  pain with palmar radial abduction endrange on radial side into the palm.  Patient had carpal tunnel release in December 24. Goal status: INITIAL  3.  Right wrist pain improved for patient to perform active range of motion in all planes symptom-free to initiate strengthening Baseline: Pain with wrist flexion extension 6/10 mostly at volar wrist and over carpal tunnel scar -as well as pain with ulnar deviation. Goal status: INITIAL  4.  Pain at lateral epicondyle improved for patient to initiate strengthening protocol. Baseline: Pain 7-8/10 with tenderness lateral epicondyle and positive test for third digit extension as well as wrist extension with resistance Goal status: INITIAL  5.  Right grip strength improved to within normal range for age symptom-free for patient to perform ADLs  Baseline: Grip decreased right 27 pounds with extended arm 30 pounds, left 75 with extended arm 45.  Pain 7-8/10 at the lateral epicondyle and tenderness Goal status: INITIAL  6.  Patient verbalized and demonstrated implementing modifications and joint protection to decrease pain at lateral condyle as well as future flareups. Baseline: No knowledge about activity modifications or activities that cause lateral condyle Goal status: INITIAL  ASSESSMENT:  CLINICAL IMPRESSION: Patient seen for occupational therapy for right lateral epicondylitis.  Patient report had some symptoms last year and again earlier this year.  Shots was successful last year but not earlier this year.  Patient's pain 7-8/10 with tenderness of the lateral epicondyle.  Pt also with increased pain at the wrist as well as the thumb.  Patient had cervical surgery December 23.  Patient had a right carpal tunnel release December 24.  Patient works at Production designer, theatre/television/film at [employer].  That does a lot of physical activity.  Patient also takes care of her mom that needs physical assistance.  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  She continues to demonstrate increased tightness and trigger points in the volar hand and forearm but responding well to cupping and use of Graston tools.  Patient continues to respond well to paraffin. Patient with no pain reported at rest but has up to 7/10 pain with continued use.  She would benefit from additional activity modification to work tasks to decrease activity demands and decrease pain.   Patient can benefit from skilled OT services to decrease pain and scar tissue increase motion and strength to return to prior level of function.  PERFORMANCE DEFICITS: in functional skills including ADLs, IADLs, ROM, strength, pain, flexibility, decreased knowledge of use of DME, and UE functional use,   and psychosocial skills including environmental adaptation and routines and behaviors.   IMPAIRMENTS: are limiting patient from ADLs, IADLs, rest and sleep, play, leisure, and social participation.   COMORBIDITIES: has no other co-morbidities that affects occupational performance. Patient will benefit from skilled OT to address above impairments and improve overall function.  MODIFICATION OR ASSISTANCE TO COMPLETE EVALUATION: No modification of tasks or assist necessary to complete an evaluation.  OT OCCUPATIONAL PROFILE AND HISTORY: Problem focused assessment: Including review of records relating to presenting problem.  CLINICAL DECISION MAKING: LOW - limited treatment options, no task modification necessary  REHAB POTENTIAL:  Good for goals  EVALUATION COMPLEXITY: Low  PLAN:  OT FREQUENCY: 1-2x/week  OT DURATION: 6 weeks  PLANNED INTERVENTIONS: 97168 OT Re-evaluation, 97535 self care/ADL training, 02889 therapeutic exercise, 97530 therapeutic activity, 97112 neuromuscular re-education, 97140 manual therapy, 97035 ultrasound, 97010 moist heat, 97010 cryotherapy, 97034 contrast bath, 97033 iontophoresis, 97760 Orthotic Initial, 97763  Orthotic/Prosthetic subsequent, passive range of motion, patient/family education, and DME and/or AE instructions  CONSULTED AND AGREED WITH PLAN OF CARE: Patient   Katrianna Friesenhahn, OTR/L,CLT 07/14/2024, 2:08 PM

## 2024-07-16 ENCOUNTER — Ambulatory Visit: Payer: PRIVATE HEALTH INSURANCE | Admitting: Occupational Therapy

## 2024-07-20 ENCOUNTER — Ambulatory Visit: Admitting: Occupational Therapy

## 2024-07-20 DIAGNOSIS — M25521 Pain in right elbow: Secondary | ICD-10-CM | POA: Diagnosis not present

## 2024-07-20 DIAGNOSIS — M79641 Pain in right hand: Secondary | ICD-10-CM

## 2024-07-20 DIAGNOSIS — L905 Scar conditions and fibrosis of skin: Secondary | ICD-10-CM

## 2024-07-20 DIAGNOSIS — M25631 Stiffness of right wrist, not elsewhere classified: Secondary | ICD-10-CM

## 2024-07-20 DIAGNOSIS — M25531 Pain in right wrist: Secondary | ICD-10-CM

## 2024-07-20 DIAGNOSIS — M6281 Muscle weakness (generalized): Secondary | ICD-10-CM

## 2024-07-20 NOTE — Therapy (Signed)
 OUTPATIENT OCCUPATIONAL THERAPY ORTHO TREATMENT  Patient Name: Valerie Pearson MRN: [id number] DOB:[birth year], 43 y.o., female Today's Date: 07/20/2024  PCP: Dr Idelia MART PROVIDER: Kip PA  END OF SESSION:  OT End of Session - 07/20/24 0945     Visit Number 5    Number of Visits 12    Date for OT Re-Evaluation 08/20/24    OT Start Time 0945    OT Stop Time 1038    OT Time Calculation (min) 53 min    Activity Tolerance Patient tolerated treatment well    Behavior During Therapy [facility] for tasks assessed/performed          Past Medical History:  Diagnosis Date   Anxiety    Cervical radiculopathy    Depression    GERD (gastroesophageal reflux disease)    Hypothyroidism    Iron deficiency anemia due to chronic blood loss    Migraines    Mild obstructive sleep apnea    Pre-diabetes    Right carpal tunnel syndrome 09/2023   Uterine fibroid    Uterine fibroid    Vitamin D insufficiency    Past Surgical History:  Procedure Laterality Date   CARPAL TUNNEL RELEASE Left 06/08/2020   Procedure: CARPAL TUNNEL RELEASE ENDOSCOPIC;  Surgeon: Edie Norleen PARAS, MD;  Location: [facility] ORS;  Service: Orthopedics;  Laterality: Left;   CARPAL TUNNEL RELEASE Right 10/29/2023   Procedure: RIGHT CARPAL TUNNEL RELEASE WITH ULTRASOUND GUIDANCE;  Surgeon: Claudene Penne ORN, MD;  Location: [facility] ORS;  Service: Neurosurgery;  Laterality: Right;   CERVICAL DISC ARTHROPLASTY N/A 10/29/2022   Procedure: C6-7 ARTHROPLASTY;  Surgeon: Clois Fret, MD;  Location: [facility] ORS;  Service: Neurosurgery;  Laterality: N/A;   CHOLECYSTECTOMY  2006   COLONOSCOPY  2013, 2018   COLONOSCOPY WITH PROPOFOL  N/A 11/13/2023   Procedure: COLONOSCOPY WITH PROPOFOL ;  Surgeon: Therisa Bi, MD;  Location: [facility] ENDOSCOPY;  Service: Gastroenterology;  Laterality: N/A;   DILATION AND CURETTAGE OF UTERUS  2014   ESOPHAGOGASTRODUODENOSCOPY  2018   ESOPHAGOGASTRODUODENOSCOPY  07/23/2022   OPERATIVE ULTRASOUND Right  10/29/2023   Procedure: OPERATIVE ULTRASOUND;  Surgeon: Claudene Penne ORN, MD;  Location: [facility] ORS;  Service: Neurosurgery;  Laterality: Right;   TUBAL LIGATION  2016   Patient Active Problem List   Diagnosis Date Noted   Pillar pain, post-operative 12/11/2023   Family history of colon cancer in father 11/13/2023   Bilateral carpal tunnel syndrome 09/25/2023   Right arm weakness 10/29/2022   Radicular syndrome of upper limbs 10/29/2022   Impaired fasting glucose 07/27/2022   Cervical radiculopathy 07/26/2022   Hypothyroidism    Depression     ONSET DATE: June 25  REFERRING DIAG: R lateral epicondylitis  THERAPY DIAG:  Pain in right elbow  Pain in right hand  Pain in right wrist  Scar tissue  Muscle weakness (generalized)  Stiffness of right wrist, not elsewhere classified  Rationale for Evaluation and Treatment: Rehabilitation  SUBJECTIVE:   SUBJECTIVE STATEMENT: The pain is better.  Since I have been coming to treatment.  It is not as constant on the days I do not work and it does not go up to a 8/10 anymore-it comes and goes like a 5/10 at the elbow my thumb.  The pain in my wrist is better. Pt accompanied by: self  PERTINENT HISTORY: Ortho visit 06/10/24:  Impression: Lateral epicondylitis of right elbow [M77.11] Lateral epicondylitis of right elbow (primary encounter diagnosis) Chronic elbow pain, right  Plan:  1. Treatment options were discussed  today with the patient. 2. The patient presents today for repeat evaluation of ongoing right lateral epicondylitis. 3. The patient does feel that the pain in the right elbow although it has returned is not as severe as it was prior to her injection. 4. she will continue with the etodolac twice daily. Tizanidine  as needed for muscle spasms. 5. She will proceed with formal occupational therapy at this time. 6. The patient will follow-up with me in 6 weeks for repeat evaluation, we will discuss possible repeat injection  versus potentially discussion of surgery.   PRECAUTIONS: None    WEIGHT BEARING RESTRICTIONS: No  PAIN:  Are you having pain?  No pain coming in today but can increase with activity 6/10  FALLS: Has patient fallen in last 6 months? 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  Also taking care of her mom that had hip fracture.  Watch TV and do her own housework  PATIENT GOALS: I want the pain better in my right arm and hand that I can feel better and do more things  NEXT MD VISIT: 6 weeks  OBJECTIVE:  Note: Objective measures were completed at Evaluation unless otherwise noted.  HAND DOMINANCE: Left  ADLs: Pain with all activities at work from stocking shelves to opening boxes to using box cutter to use doing cashier job as well as helping her mom with transfers and bathing her.  Also with cooking and cleaning and laundry  FUNCTIONAL OUTCOME MEASURES: Extension UPPER EXTREMITY ROM:     Active ROM Right eval Left eval  Shoulder flexion    Shoulder abduction    Shoulder adduction    Shoulder extension    Shoulder internal rotation    Shoulder external rotation    Elbow flexion    Elbow extension    Wrist flexion Pain    Wrist extension pain   Wrist ulnar deviation pain   Wrist radial deviation    Wrist pronation    Wrist supination    (Blank rows = not tested)  Active ROM Right eval Left eval  Thumb MCP (0-60)    Thumb IP (0-80)    Thumb Radial abd/add (0-55) 55 pain     Thumb Palmar abd/add (0-45) 55 pain     Thumb Opposition to Small Finger  Base of 5th -pain to 4th , 5th and DPC     Index MCP (0-90)     Index PIP (0-100)     Index DIP (0-70)      Long MCP (0-90)      Long PIP (0-100)      Long DIP (0-70)      Ring MCP (0-90)      Ring PIP (0-100)      Ring DIP (0-70)      Little MCP (0-90)      Little PIP (0-100)      Little  DIP (0-70)      (Blank rows = not tested)   HAND FUNCTION: Grip strength: Right: 27 extended arm 30  lbs; Left: 75 extended arm 45  lbs, Lateral pinch: Right: 14 lbs, Left: 16 lbs, and 3 point pinch: Right: 9 lbs, Left: 17 lbs 07/14/24 Grip strength: Right: 40 extended arm 30  lbs; Left: 55 extended arm 50  lbs, Lateral pinch: Right: 14 lbs, Left: 16 lbs, and 3 point pinch: Right: 9 lbs, Left:  17 lbs  COORDINATION: Within functional limits with pain with thumb mobilization manipulating small objects palm to fingertips, pain with 3-point pinch and opening packages a  SENSATION: No sensory issues mentioned but will rate continue to assess  EDEMA: Minimal  COGNITION: Overall cognitive status: Within functional limits for tasks assessed    TREATMENT DATE: 07/20/24    She denies any pain at rest but reports up to 5/10 pain at times with use at the lateral epicondyle and thumb Patient rife with increased pain and tenderness at the thumb webspace. Continues to tenderness at the lateral epicondyle Wrist range of motion improved as well as less pain. Pain mostly with wrist extension at the elbow  Paraffin:  Paraffin applied to right hand and wrist  to decrease pain, stiffness and to increase motion at the digits and wrist prior to manual therapy.  Heat at the same time at the elbow during paraffin Time: 6 minutes  Location: Right elbow lateral epicondyle forearm  Decrease pain and increase flexibility prior to review of home exercises and stretches   Manual Therapy and Therapeutic Exercises Following paraffin, pt was seen for manual therapy with emphasis on carpal and metacarpal spreads, soft tissue massage and mobilization to the webspace in combination with thumb palmar radial abduction and wrist extension.  Graston tool #2 utilized for sweeping over the volar forearm and hand.   Performed dry cupping 5 cm cups -2 cups on flexors proximal and distal with a light to medium pressure and  static with AAROM wrist flexion extension 10 reps.  Repeated also same  3 cups size 5 over the proximal volar flexors of the forearm and palm with AAROM wrist flexion extension and digits flexion and extension 10 reps. Repeated 3 times  As well as 2 cups on dorsal forearm combination with wrist flexion stretch extended arm 10 reps With increased motion as well as decreased pain and discomfort Also done static clip for trigger point release and thumb webspace 3 x 1 minute in combination with cupping  Patient to continue with home program of soft tissue mobilization over right roller for volar forearm wrist and palm to decrease trigger points and increased blood flow prior to range of motion Continued use of counterforce brace, benik neoprene wrap with thumb piece for the wrist  Recommend pt continue to analyze work tasks and find alternative ways to modify tasks to decrease activity demands on her UE.  Patient to apply moist heat 2-3 times a day, followed by cross friction massage to the lateral epicondyle in all directions Followed by elbow to side wrist composite flexion stretch and pronation as well as neutral 5 reps each 5-second hold Can also do extended arm support open hand wrist flexion in pronation 5 reps hold 5 seconds Ice massage afterwards  Iontophoresis with dexamethasone  performed with a medium patch on right lateral epicondyle,  2.0 intensity at 20 minutes .  Patient was educated in iontophoresis use and precautions and protocol. Skin check done prior to session.  Tolerated well today.  Patient to keep patch on for hour after leaving     PATIENT EDUCATION: Education details: findings of eval and HEP  Person educated: Patient Education method: Explanation, Demonstration, Tactile cues, Verbal cues, and Handouts Education comprehension: verbalized understanding, returned demonstration, verbal cues required, and needs further education    GOALS: Goals reviewed with patient?  Yes    LONG TERM GOALS: Target date: 8 wks   Patient to be independent in home program performing modalities  as well as correct braces and splints to decrease pain at rest to less than 2/10. Baseline: Pain 7-8/10 coming in today.  At elbow and wrist.  Wearing counterforce strap but not always helpful.-Patient with increased wrist pain and thumb pain and elbow pain. Goal status: INITIAL  2.  Patient is right thumb active range of motion and palmar radial abduction as well as opposition symptom-free to retrieve objects out of palm and opening small packages symptom-free Baseline: Patient pain with thumb opposition to 4th and 5th as well as distal palmar crease also pain with palmar radial abduction endrange on radial side into the palm.  Patient had carpal tunnel release in December 24. Goal status: INITIAL  3.  Right wrist pain improved for patient to perform active range of motion in all planes symptom-free to initiate strengthening Baseline: Pain with wrist flexion extension 6/10 mostly at volar wrist and over carpal tunnel scar -as well as pain with ulnar deviation. Goal status: INITIAL  4.  Pain at lateral epicondyle improved for patient to initiate strengthening protocol. Baseline: Pain 7-8/10 with tenderness lateral epicondyle and positive test for third digit extension as well as wrist extension with resistance Goal status: INITIAL  5.  Right grip strength improved to within normal range for age symptom-free for patient to perform ADLs  Baseline: Grip decreased right 27 pounds with extended arm 30 pounds, left 75 with extended arm 45.  Pain 7-8/10 at the lateral epicondyle and tenderness Goal status: INITIAL  6.  Patient verbalized and demonstrated implementing modifications and joint protection to decrease pain at lateral condyle as well as future flareups. Baseline: No knowledge about activity modifications or activities that cause lateral condyle Goal status:  INITIAL  ASSESSMENT:  CLINICAL IMPRESSION: Patient seen for occupational therapy for right lateral epicondylitis.  Patient report had some symptoms last year and again earlier this year.  Shots was successful last year but not earlier this year.  Patient's pain 7-8/10 with tenderness of the lateral epicondyle.  Pt also with increased pain at the wrist as well as the thumb.  Patient had cervical surgery December 23.  Patient had a right carpal tunnel release December 24.  Patient works at Production designer, theatre/television/film at [employer].  That does a lot of physical activity.  Patient also takes care of her mom that needs physical assistance.  NOW:  Patient pain decreased to not as intense as well as not as constant-pain at the wrist improving.  Continues to have some pain at the thumb as well as lateral upper condyle.  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  She continues to demonstrate increased tightness and trigger points in the volar hand and forearm but responding well to cupping and use of Graston tools.  Patient continues to respond well to paraffin. Patient with no pain reported at rest but has up to 5/10 pain with continued use.  Grip increased see flowsheet.  She would benefit from additional activity modification to work tasks to decrease activity demands and decrease pain.   Patient can benefit from skilled OT services to decrease pain and scar tissue increase motion and strength to return to prior level of function.  PERFORMANCE DEFICITS: in functional skills including ADLs, IADLs, ROM, strength, pain, flexibility, decreased knowledge of use of DME, and UE functional use,   and psychosocial skills including environmental adaptation and routines and behaviors.   IMPAIRMENTS: are limiting patient from ADLs, IADLs, rest and sleep, play,  leisure, and social participation.   COMORBIDITIES: has no other co-morbidities that affects occupational performance.  Patient will benefit from skilled OT to address above impairments and improve overall function.  MODIFICATION OR ASSISTANCE TO COMPLETE EVALUATION: No modification of tasks or assist necessary to complete an evaluation.  OT OCCUPATIONAL PROFILE AND HISTORY: Problem focused assessment: Including review of records relating to presenting problem.  CLINICAL DECISION MAKING: LOW - limited treatment options, no task modification necessary  REHAB POTENTIAL: Good for goals  EVALUATION COMPLEXITY: Low  PLAN:  OT FREQUENCY: 1-2x/week  OT DURATION: 6 weeks  PLANNED INTERVENTIONS: 97168 OT Re-evaluation, 97535 self care/ADL training, 02889 therapeutic exercise, 97530 therapeutic activity, 97112 neuromuscular re-education, 97140 manual therapy, 97035 ultrasound, 97010 moist heat, 97010 cryotherapy, 97034 contrast bath, 97033 iontophoresis, 97760 Orthotic Initial, 97763 Orthotic/Prosthetic subsequent, passive range of motion, patient/family education, and DME and/or AE instructions  CONSULTED AND AGREED WITH PLAN OF CARE: Patient   Ancel Peters, OTR/L,CLT 07/20/2024, 10:23 AM

## 2024-07-21 ENCOUNTER — Ambulatory Visit: Payer: PRIVATE HEALTH INSURANCE | Admitting: Occupational Therapy

## 2024-07-23 ENCOUNTER — Ambulatory Visit: Payer: PRIVATE HEALTH INSURANCE | Admitting: Occupational Therapy

## 2024-07-23 DIAGNOSIS — M25631 Stiffness of right wrist, not elsewhere classified: Secondary | ICD-10-CM

## 2024-07-23 DIAGNOSIS — M25521 Pain in right elbow: Secondary | ICD-10-CM | POA: Diagnosis not present

## 2024-07-23 DIAGNOSIS — M25531 Pain in right wrist: Secondary | ICD-10-CM

## 2024-07-23 DIAGNOSIS — L905 Scar conditions and fibrosis of skin: Secondary | ICD-10-CM

## 2024-07-23 DIAGNOSIS — M79641 Pain in right hand: Secondary | ICD-10-CM

## 2024-07-23 DIAGNOSIS — M6281 Muscle weakness (generalized): Secondary | ICD-10-CM

## 2024-07-23 NOTE — Therapy (Signed)
 OUTPATIENT OCCUPATIONAL THERAPY ORTHO TREATMENT  Patient Name: Valerie Pearson MRN: [id number] DOB:August 16, 1981, 43 y.o., female Today's Date: 07/23/2024  PCP: Dr Idelia MART PROVIDER: Kip PA  END OF SESSION:  OT End of Session - 07/23/24 1536     Visit Number 6    Number of Visits 12    Date for OT Re-Evaluation 08/20/24    OT Start Time 1537    OT Stop Time 1615    OT Time Calculation (min) 38 min    Activity Tolerance Patient tolerated treatment well    Behavior During Therapy [facility] for tasks assessed/performed          Past Medical History:  Diagnosis Date   Anxiety    Cervical radiculopathy    Depression    GERD (gastroesophageal reflux disease)    Hypothyroidism    Iron deficiency anemia due to chronic blood loss    Migraines    Mild obstructive sleep apnea    Pre-diabetes    Right carpal tunnel syndrome 09/2023   Uterine fibroid    Uterine fibroid    Vitamin D insufficiency    Past Surgical History:  Procedure Laterality Date   CARPAL TUNNEL RELEASE Left 06/08/2020   Procedure: CARPAL TUNNEL RELEASE ENDOSCOPIC;  Surgeon: Edie Norleen PARAS, MD;  Location: [facility] ORS;  Service: Orthopedics;  Laterality: Left;   CARPAL TUNNEL RELEASE Right 10/29/2023   Procedure: RIGHT CARPAL TUNNEL RELEASE WITH ULTRASOUND GUIDANCE;  Surgeon: Claudene Penne ORN, MD;  Location: [facility] ORS;  Service: Neurosurgery;  Laterality: Right;   CERVICAL DISC ARTHROPLASTY N/A 10/29/2022   Procedure: C6-7 ARTHROPLASTY;  Surgeon: Clois Fret, MD;  Location: [facility] ORS;  Service: Neurosurgery;  Laterality: N/A;   CHOLECYSTECTOMY  2006   COLONOSCOPY  2013, 2018   COLONOSCOPY WITH PROPOFOL  N/A 11/13/2023   Procedure: COLONOSCOPY WITH PROPOFOL ;  Surgeon: Therisa Bi, MD;  Location: [facility];  Service: Gastroenterology;  Laterality: N/A;   DILATION AND CURETTAGE OF UTERUS  2014   ESOPHAGOGASTRODUODENOSCOPY  2018   ESOPHAGOGASTRODUODENOSCOPY  07/23/2022   OPERATIVE ULTRASOUND Right  10/29/2023   Procedure: OPERATIVE ULTRASOUND;  Surgeon: Claudene Penne ORN, MD;  Location: [facility] ORS;  Service: Neurosurgery;  Laterality: Right;   TUBAL LIGATION  2016   Patient Active Problem List   Diagnosis Date Noted   Pillar pain, post-operative 12/11/2023   Family history of colon cancer in father 11/13/2023   Bilateral carpal tunnel syndrome 09/25/2023   Right arm weakness 10/29/2022   Radicular syndrome of upper limbs 10/29/2022   Impaired fasting glucose 07/27/2022   Cervical radiculopathy 07/26/2022   Hypothyroidism    Depression     ONSET DATE: June 25  REFERRING DIAG: R lateral epicondylitis  THERAPY DIAG:  Pain in right elbow  Pain in right hand  Pain in right wrist  Scar tissue  Muscle weakness (generalized)  Stiffness of right wrist, not elsewhere classified  Rationale for Evaluation and Treatment: Rehabilitation  SUBJECTIVE:   SUBJECTIVE STATEMENT: I have seen orthopedics yesterday.  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  But I can feel like I have more strength in my hand.  Wrist and hand is getting better.  Today with pain is actually okay.   Pt accompanied by: self  PERTINENT HISTORY: Ortho visit 06/10/24:  Impression: Lateral epicondylitis of right elbow [M77.11] Lateral epicondylitis of right elbow (primary encounter diagnosis) Chronic elbow pain, right  Plan:  1. Treatment options were discussed today with the patient. 2.  The patient presents today for repeat evaluation of ongoing right lateral epicondylitis. 3. The patient does feel that the pain in the right elbow although it has returned is not as severe as it was prior to her injection. 4. she will continue with the etodolac twice daily. Tizanidine  as needed for muscle spasms. 5. She will proceed with formal occupational therapy at this time. 6. The patient will follow-up with me in 6 weeks for repeat evaluation, we will discuss possible repeat injection versus  potentially discussion of surgery.   PRECAUTIONS: None    WEIGHT BEARING RESTRICTIONS: No  PAIN:  Are you having pain?  No pain coming in today but upon assessment for wrist extension as well as wrist flexion and thumb palmar abduction with resistance had pain 3-4/10 at the radial wrist, volar wrist carpal tunnel surgery and lateral epicondyle FALLS: Has patient fallen in last 6 months? 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  Also taking care of her mom that had hip fracture.  Watch TV and do her own housework  PATIENT GOALS: I want the pain better in my right arm and hand that I can feel better and do more things  NEXT MD VISIT: 6 weeks  OBJECTIVE:  Note: Objective measures were completed at Evaluation unless otherwise noted.  HAND DOMINANCE: Left  ADLs: Pain with all activities at work from stocking shelves to opening boxes to using box cutter to use doing cashier job as well as helping her mom with transfers and bathing her.  Also with cooking and cleaning and laundry  FUNCTIONAL OUTCOME MEASURES: Extension UPPER EXTREMITY ROM:     Active ROM Right eval Left eval  Shoulder flexion    Shoulder abduction    Shoulder adduction    Shoulder extension    Shoulder internal rotation    Shoulder external rotation    Elbow flexion    Elbow extension    Wrist flexion Pain    Wrist extension pain   Wrist ulnar deviation pain   Wrist radial deviation    Wrist pronation    Wrist supination    (Blank rows = not tested)  Active ROM Right eval Left eval  Thumb MCP (0-60)    Thumb IP (0-80)    Thumb Radial abd/add (0-55) 55 pain     Thumb Palmar abd/add (0-45) 55 pain     Thumb Opposition to Small Finger  Base of 5th -pain to 4th , 5th and DPC     Index MCP (0-90)     Index PIP (0-100)     Index DIP (0-70)      Long MCP (0-90)      Long PIP  (0-100)      Long DIP (0-70)      Ring MCP (0-90)      Ring PIP (0-100)      Ring DIP (0-70)      Little MCP (0-90)      Little PIP (0-100)      Little DIP (0-70)      (Blank rows = not tested)   HAND FUNCTION: Grip strength: Right: 27 extended arm 30  lbs; Left: 75 extended arm 45  lbs, Lateral pinch: Right: 14 lbs, Left: 16 lbs, and 3 point pinch: Right: 9 lbs, Left: 17 lbs 07/14/24 Grip strength: Right: 40 extended arm 30  lbs; Left: 55 extended  arm 50  lbs, Lateral pinch: Right: 14 lbs, Left: 16 lbs, and 3 point pinch: Right: 9 lbs, Left: 17 lbs 07/23/24 Grip strength: Right: 40 extended arm 30  lbs; Left: 55 extended arm 50  lbs, Lateral pinch: Right: 17 lbs, Left: 16 lbs, and 3 point pinch: Right:  11 lbs, Left: 17 lbs  COORDINATION: Within functional limits with pain with thumb mobilization manipulating small objects palm to fingertips, pain with 3-point pinch and opening packages a  SENSATION: No sensory issues mentioned but will rate continue to assess  EDEMA: Minimal  COGNITION: Overall cognitive status: Within functional limits for tasks assessed    TREATMENT DATE: 07/23/24    She denies any pain at rest but reports up to 5/10 pain at times with use at the lateral epicondyle, volar wrist and thumb Continues to tenderness at the lateral epicondyle Wrist range of motion improved as well as less pain. Pain mostly with wrist extension at the elbow Prehension strength improved greatly.  Grip improved the previous time.  Paraffin:  Paraffin applied to right hand and wrist  to decrease pain, stiffness and to increase motion at the digits and wrist prior to manual therapy.  Heat at the same time at the elbow during paraffin Time: 6 minutes  Location: Right elbow lateral epicondyle forearm  Decrease pain and increase flexibility prior to review of home exercises and stretches   Manual Therapy and Therapeutic Exercises Following paraffin, pt was seen for manual therapy  with emphasis on carpal and metacarpal spreads, soft tissue massage and mobilization to the webspace in combination with thumb palmar radial abduction and wrist extension.  Graston tool #2 utilized for sweeping over the volar forearm and hand.   Performed dry cupping 5 cm cups -2 cups on flexors proximal and distal with a light to medium pressure and static with AAROM wrist flexion extension 10 reps.  Repeated also same  3 cups size 5 over the proximal volar flexors of the forearm and palm with AAROM wrist flexion extension and digits flexion and extension 10 reps. Repeated 3 times  As well as 2 cups on dorsal forearm combination with wrist flexion stretch extended arm 10 reps With increased motion as well as decreased pain and discomfort Also done static clip for trigger point release and thumb webspace 2 x 1 minute in combination with cupping  Patient to continue with home program of soft tissue mobilization over right roller for volar forearm wrist and palm to decrease trigger points and increased blood flow prior to range of motion Continued use of counterforce brace, benik neoprene wrap with thumb piece for the wrist  Recommend pt continue to analyze work tasks and find alternative ways to modify tasks to decrease activity demands on her UE.  Patient to apply moist heat 2-3 times a day, followed by cross friction massage to the lateral epicondyle in all directions Followed by elbow to side wrist composite flexion stretch and pronation as well as neutral 5 reps each 5-second hold Can also do extended arm support open hand wrist flexion in pronation 5 reps hold 5 seconds Ice massage afterwards  Iontophoresis with dexamethasone  performed with a medium patch on right lateral epicondyle,  2.0 intensity at 10 minutes-patient had to leave earlier to pick up her son .  Patient was educated in iontophoresis use and precautions and protocol. Skin check done prior to session.  Tolerated well today.   Patient to keep patch on for hour after leaving  PATIENT EDUCATION: Education details: findings of eval and HEP  Person educated: Patient Education method: Explanation, Demonstration, Tactile cues, Verbal cues, and Handouts Education comprehension: verbalized understanding, returned demonstration, verbal cues required, and needs further education    GOALS: Goals reviewed with patient? Yes    LONG TERM GOALS: Target date: 8 wks   Patient to be independent in home program performing modalities as well as correct braces and splints to decrease pain at rest to less than 2/10. Baseline: Pain 7-8/10 coming in today.  At elbow and wrist.  Wearing counterforce strap but not always helpful.-Patient with increased wrist pain and thumb pain and elbow pain. Goal status: INITIAL  2.  Patient is right thumb active range of motion and palmar radial abduction as well as opposition symptom-free to retrieve objects out of palm and opening small packages symptom-free Baseline: Patient pain with thumb opposition to 4th and 5th as well as distal palmar crease also pain with palmar radial abduction endrange on radial side into the palm.  Patient had carpal tunnel release in December 24. Goal status: INITIAL  3.  Right wrist pain improved for patient to perform active range of motion in all planes symptom-free to initiate strengthening Baseline: Pain with wrist flexion extension 6/10 mostly at volar wrist and over carpal tunnel scar -as well as pain with ulnar deviation. Goal status: INITIAL  4.  Pain at lateral epicondyle improved for patient to initiate strengthening protocol. Baseline: Pain 7-8/10 with tenderness lateral epicondyle and positive test for third digit extension as well as wrist extension with resistance Goal status: INITIAL  5.  Right grip strength improved to within normal range for age symptom-free for patient to perform ADLs  Baseline: Grip decreased right 27 pounds with extended  arm 30 pounds, left 75 with extended arm 45.  Pain 7-8/10 at the lateral epicondyle and tenderness Goal status: INITIAL  6.  Patient verbalized and demonstrated implementing modifications and joint protection to decrease pain at lateral condyle as well as future flareups. Baseline: No knowledge about activity modifications or activities that cause lateral condyle Goal status: INITIAL  ASSESSMENT:  CLINICAL IMPRESSION: Patient seen for occupational therapy for right lateral epicondylitis.  Patient report had some symptoms last year and again earlier this year.  Shots was successful last year but not earlier this year.  Patient's pain 7-8/10 with tenderness of the lateral epicondyle.  Pt also with increased pain at the wrist as well as the thumb.  Patient had cervical surgery December 23.  Patient had a right carpal tunnel release December 24.  Patient works at Production designer, theatre/television/film at [employer].  That does a lot of physical activity.  Patient also takes care of her mom that needs physical assistance.  NOW:  Patient pain decreased to not as intense as well as not as constant-pain at the wrist improving.  Pain improved at the wrist and thumb.  Continues to be at lateral epicondyle.  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  She continues to demonstrate increased tightness and trigger points in the volar hand and forearm but responding well to cupping and use of Graston tools.  Patient grip and prehension strength improved greatly since start of care patient continues to respond well to paraffin. Patient with no pain reported at rest but has up to 5/10 pain with continued use.    She would benefit from additional activity modification to work tasks to decrease activity demands and decrease pain.  Patient can benefit from skilled OT services to decrease pain and scar tissue increase motion and strength to return to prior level of function.  PERFORMANCE  DEFICITS: in functional skills including ADLs, IADLs, ROM, strength, pain, flexibility, decreased knowledge of use of DME, and UE functional use,   and psychosocial skills including environmental adaptation and routines and behaviors.   IMPAIRMENTS: are limiting patient from ADLs, IADLs, rest and sleep, play, leisure, and social participation.   COMORBIDITIES: has no other co-morbidities that affects occupational performance. Patient will benefit from skilled OT to address above impairments and improve overall function.  MODIFICATION OR ASSISTANCE TO COMPLETE EVALUATION: No modification of tasks or assist necessary to complete an evaluation.  OT OCCUPATIONAL PROFILE AND HISTORY: Problem focused assessment: Including review of records relating to presenting problem.  CLINICAL DECISION MAKING: LOW - limited treatment options, no task modification necessary  REHAB POTENTIAL: Good for goals  EVALUATION COMPLEXITY: Low  PLAN:  OT FREQUENCY: 1-2x/week  OT DURATION: 6 weeks  PLANNED INTERVENTIONS: 97168 OT Re-evaluation, 97535 self care/ADL training, 02889 therapeutic exercise, 97530 therapeutic activity, 97112 neuromuscular re-education, 97140 manual therapy, 97035 ultrasound, 97010 moist heat, 97010 cryotherapy, 97034 contrast bath, 97033 iontophoresis, 97760 Orthotic Initial, 97763 Orthotic/Prosthetic subsequent, passive range of motion, patient/family education, and DME and/or AE instructions  CONSULTED AND AGREED WITH PLAN OF CARE: Patient   Ancel Peters, OTR/L,CLT 07/23/2024, 4:53 PM

## 2024-08-04 ENCOUNTER — Ambulatory Visit: Payer: PRIVATE HEALTH INSURANCE | Attending: Student | Admitting: Occupational Therapy

## 2024-08-04 DIAGNOSIS — L905 Scar conditions and fibrosis of skin: Secondary | ICD-10-CM | POA: Insufficient documentation

## 2024-08-04 DIAGNOSIS — M25521 Pain in right elbow: Secondary | ICD-10-CM | POA: Diagnosis present

## 2024-08-04 DIAGNOSIS — M25631 Stiffness of right wrist, not elsewhere classified: Secondary | ICD-10-CM | POA: Insufficient documentation

## 2024-08-04 DIAGNOSIS — M25531 Pain in right wrist: Secondary | ICD-10-CM | POA: Diagnosis present

## 2024-08-04 DIAGNOSIS — M6281 Muscle weakness (generalized): Secondary | ICD-10-CM | POA: Diagnosis present

## 2024-08-04 DIAGNOSIS — M79641 Pain in right hand: Secondary | ICD-10-CM | POA: Insufficient documentation

## 2024-08-04 NOTE — Therapy (Signed)
 OUTPATIENT OCCUPATIONAL THERAPY ORTHO TREATMENT  Patient Name: Valerie Pearson MRN: [id number] DOB:[birth year], 43 y.o., female Today's Date: 08/04/2024  PCP: Dr Idelia REFERRING PROVIDER: Kip PA  END OF SESSION:  OT End of Session - 08/04/24 1445     Visit Number 7    Number of Visits 12    Date for OT Re-Evaluation 08/20/24    OT Start Time 1445    OT Stop Time 1540    OT Time Calculation (min) 55 min    Activity Tolerance Patient tolerated treatment well    Behavior During Therapy [facility] for tasks assessed/performed          Past Medical History:  Diagnosis Date   Anxiety    Cervical radiculopathy    Depression    GERD (gastroesophageal reflux disease)    Hypothyroidism    Iron deficiency anemia due to chronic blood loss    Migraines    Mild obstructive sleep apnea    Pre-diabetes    Right carpal tunnel syndrome 09/2023   Uterine fibroid    Uterine fibroid    Vitamin D insufficiency    Past Surgical History:  Procedure Laterality Date   CARPAL TUNNEL RELEASE Left 06/08/2020   Procedure: CARPAL TUNNEL RELEASE ENDOSCOPIC;  Surgeon: Edie Norleen PARAS, MD;  Location: [facility] ORS;  Service: Orthopedics;  Laterality: Left;   CARPAL TUNNEL RELEASE Right 10/29/2023   Procedure: RIGHT CARPAL TUNNEL RELEASE WITH ULTRASOUND GUIDANCE;  Surgeon: Claudene Penne ORN, MD;  Location: [facility] ORS;  Service: Neurosurgery;  Laterality: Right;   CERVICAL DISC ARTHROPLASTY N/A 10/29/2022   Procedure: C6-7 ARTHROPLASTY;  Surgeon: Clois Fret, MD;  Location: [facility] ORS;  Service: Neurosurgery;  Laterality: N/A;   CHOLECYSTECTOMY  2006   COLONOSCOPY  2013, 2018   COLONOSCOPY WITH PROPOFOL  N/A 11/13/2023   Procedure: COLONOSCOPY WITH PROPOFOL ;  Surgeon: Therisa Bi, MD;  Location: [facility] ENDOSCOPY;  Service: Gastroenterology;  Laterality: N/A;   DILATION AND CURETTAGE OF UTERUS  2014   ESOPHAGOGASTRODUODENOSCOPY  2018   ESOPHAGOGASTRODUODENOSCOPY  07/23/2022   OPERATIVE ULTRASOUND Right  10/29/2023   Procedure: OPERATIVE ULTRASOUND;  Surgeon: Claudene Penne ORN, MD;  Location: [facility] ORS;  Service: Neurosurgery;  Laterality: Right;   TUBAL LIGATION  2016   Patient Active Problem List   Diagnosis Date Noted   Pillar pain, post-operative 12/11/2023   Family history of colon cancer in father 11/13/2023   Bilateral carpal tunnel syndrome 09/25/2023   Right arm weakness 10/29/2022   Radicular syndrome of upper limbs 10/29/2022   Impaired fasting glucose 07/27/2022   Cervical radiculopathy 07/26/2022   Hypothyroidism    Depression     ONSET DATE: June 25  REFERRING DIAG: R lateral epicondylitis  THERAPY DIAG:  Pain in right elbow  Pain in right hand  Pain in right wrist  Scar tissue  Muscle weakness (generalized)  Stiffness of right wrist, not elsewhere classified  Rationale for Evaluation and Treatment: Rehabilitation  SUBJECTIVE:   SUBJECTIVE STATEMENT: My hand and elbow is good when I leave therapy and at home -but at work my elbow and thumb still hurts about 6-8/10 - we are short staff so have to do a lot of the work   Pt accompanied by: self  PERTINENT HISTORY: Ortho visit 06/10/24:  Impression: Lateral epicondylitis of right elbow [M77.11] Lateral epicondylitis of right elbow (primary encounter diagnosis) Chronic elbow pain, right  Plan:  1. Treatment options were discussed today with the patient. 2. The patient presents today for repeat evaluation of ongoing  right lateral epicondylitis. 3. The patient does feel that the pain in the right elbow although it has returned is not as severe as it was prior to her injection. 4. she will continue with the etodolac twice daily. Tizanidine  as needed for muscle spasms. 5. She will proceed with formal occupational therapy at this time. 6. The patient will follow-up with me in 6 weeks for repeat evaluation, we will discuss possible repeat injection versus potentially discussion of surgery.   PRECAUTIONS:  None    WEIGHT BEARING RESTRICTIONS: No  PAIN:  Are you having pain?  No pain coming in today but upon assessment for wrist extension as well as wrist flexion and thumb palmar abduction with resistance had pain 3-4/10 at the radial wrist, volar wrist carpal tunnel surgery and lateral epicondyle FALLS: Has patient fallen in last 6 months? 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  Also taking care of her mom that had hip fracture.  Watch TV and do her own housework  PATIENT GOALS: I want the pain better in my right arm and hand that I can feel better and do more things  NEXT MD VISIT: 6 weeks  OBJECTIVE:  Note: Objective measures were completed at Evaluation unless otherwise noted.  HAND DOMINANCE: Left  ADLs: Pain with all activities at work from stocking shelves to opening boxes to using box cutter to use doing cashier job as well as helping her mom with transfers and bathing her.  Also with cooking and cleaning and laundry  FUNCTIONAL OUTCOME MEASURES: Extension UPPER EXTREMITY ROM:     Active ROM Right eval Left eval  Shoulder flexion    Shoulder abduction    Shoulder adduction    Shoulder extension    Shoulder internal rotation    Shoulder external rotation    Elbow flexion    Elbow extension    Wrist flexion Pain    Wrist extension pain   Wrist ulnar deviation pain   Wrist radial deviation    Wrist pronation    Wrist supination    (Blank rows = not tested)  Active ROM Right eval Left eval  Thumb MCP (0-60)    Thumb IP (0-80)    Thumb Radial abd/add (0-55) 55 pain     Thumb Palmar abd/add (0-45) 55 pain     Thumb Opposition to Small Finger  Base of 5th -pain to 4th , 5th and DPC     Index MCP (0-90)     Index PIP (0-100)     Index DIP (0-70)      Long MCP (0-90)      Long PIP (0-100)      Long DIP (0-70)      Ring MCP (0-90)       Ring PIP (0-100)      Ring DIP (0-70)      Little MCP (0-90)      Little PIP (0-100)      Little DIP (0-70)      (Blank rows = not tested)   HAND FUNCTION: Grip strength: Right: 27 extended arm 30  lbs; Left: 75 extended arm 45  lbs, Lateral pinch: Right: 14 lbs, Left: 16 lbs, and 3 point pinch: Right: 9 lbs, Left: 17 lbs 07/14/24 Grip strength: Right: 40 extended arm 30  lbs; Left: 55 extended arm 50  lbs, Lateral pinch: Right: 14 lbs,  Left: 16 lbs, and 3 point pinch: Right: 9 lbs, Left: 17 lbs 07/23/24 Grip strength: Right: 40 extended arm 30  lbs; Left: 55 extended arm 50  lbs, Lateral pinch: Right: 17 lbs, Left: 16 lbs, and 3 point pinch: Right:  11 lbs, Left: 17 lbs  COORDINATION: Within functional limits with pain with thumb mobilization manipulating small objects palm to fingertips, pain with 3-point pinch and opening packages a  SENSATION: No sensory issues mentioned but will rate continue to assess  EDEMA: Minimal  COGNITION: Overall cognitive status: Within functional limits for tasks assessed    TREATMENT DATE: 08/04/24    Patient continues to be tender over the lateral epicondyle on the right. As well as scar adhesion at the volar right wrist carpal tunnel.  Were arthroscopic was done.  Pain with scar mobilization and wrist flexion extension. Report during the day with functional use most of the pain is lateral but condyle as well as thumb webspace and radial wrist.  Paraffin:  Paraffin applied to right hand and wrist  to decrease pain, stiffness and to increase motion at the digits and wrist prior to manual therapy.  Heat at the same time at the elbow during paraffin Time: 6 minutes  Location: Right elbow lateral epicondyle forearm  Decrease pain and increase flexibility prior to review of home exercises and stretches   Manual Therapy and Therapeutic Exercises Following paraffin, pt was seen for manual therapy with emphasis on carpal and metacarpal spreads, soft  tissue massage and mobilization to the webspace in combination with thumb palmar radial abduction and wrist extension.  Graston tool #2 utilized for sweeping over the volar forearm and hand.   Performed dry cupping 5 cm cups -2 cups on flexors proximal and distal with a light to medium pressure and static with AAROM wrist flexion extension 10 reps.  Repeated also same  3 cups size 5 over the proximal volar flexors of the forearm and palm with AAROM wrist flexion extension and digits flexion and extension 10 reps. Repeated 3 times  As well as 2 cups on dorsal forearm combination with wrist flexion stretch extended arm 10 reps With increased motion as well as decreased pain and discomfort Also done static clip for trigger point release and thumb webspace 2 x 1 minute in combination with cupping Focus today on scar mobilization at the volar carpal tunnel scar.  Deep and adhere.  Used cross friction massage as well as extractor as well as Radiation protection practitioner. Apply Kinesiotape 3 and a*at 100% pull during the day during active range of motion to facilitate scar mobilization.  Patient can do scar massage for it Patient to do during the day not at night  Patient to continue with home program of soft tissue mobilization over right roller for volar forearm wrist and palm to decrease trigger points and increased blood flow prior to range of motion Continued use of counterforce brace, benik neoprene wrap with thumb piece for the wrist  Recommend pt continue to analyze work tasks and find alternative ways to modify tasks to decrease activity demands on her UE.   Iontophoresis with dexamethasone  performed with a medium patch on right lateral epicondyle,  21.7  intensity at 20 minutes- .  Patient was educated in iontophoresis use and precautions and protocol. Skin check done prior to session.  Tolerated well today.  Patient to keep patch on for hour after leaving     PATIENT EDUCATION: Education details:  findings of eval and HEP  Person  educated: Patient Education method: Explanation, Demonstration, Tactile cues, Verbal cues, and Handouts Education comprehension: verbalized understanding, returned demonstration, verbal cues required, and needs further education    GOALS: Goals reviewed with patient? Yes    LONG TERM GOALS: Target date: 8 wks   Patient to be independent in home program performing modalities as well as correct braces and splints to decrease pain at rest to less than 2/10. Baseline: Pain 7-8/10 coming in today.  At elbow and wrist.  Wearing counterforce strap but not always helpful.-Patient with increased wrist pain and thumb pain and elbow pain. Goal status: INITIAL  2.  Patient is right thumb active range of motion and palmar radial abduction as well as opposition symptom-free to retrieve objects out of palm and opening small packages symptom-free Baseline: Patient pain with thumb opposition to 4th and 5th as well as distal palmar crease also pain with palmar radial abduction endrange on radial side into the palm.  Patient had carpal tunnel release in December 24. Goal status: INITIAL  3.  Right wrist pain improved for patient to perform active range of motion in all planes symptom-free to initiate strengthening Baseline: Pain with wrist flexion extension 6/10 mostly at volar wrist and over carpal tunnel scar -as well as pain with ulnar deviation. Goal status: INITIAL  4.  Pain at lateral epicondyle improved for patient to initiate strengthening protocol. Baseline: Pain 7-8/10 with tenderness lateral epicondyle and positive test for third digit extension as well as wrist extension with resistance Goal status: INITIAL  5.  Right grip strength improved to within normal range for age symptom-free for patient to perform ADLs  Baseline: Grip decreased right 27 pounds with extended arm 30 pounds, left 75 with extended arm 45.  Pain 7-8/10 at the lateral epicondyle and  tenderness Goal status: INITIAL  6.  Patient verbalized and demonstrated implementing modifications and joint protection to decrease pain at lateral condyle as well as future flareups. Baseline: No knowledge about activity modifications or activities that cause lateral condyle Goal status: INITIAL  ASSESSMENT:  CLINICAL IMPRESSION: Patient seen for occupational therapy for right lateral epicondylitis.  Patient report had some symptoms last year and again earlier this year.  Shots was successful last year but not earlier this year.  Patient's pain 7-8/10 with tenderness of the lateral epicondyle.  Pt also with increased pain at the wrist as well as the thumb.  Patient had cervical surgery December 23.  Patient had a right carpal tunnel release December 24.  Patient works at Production designer, theatre/television/film at [employer].  That does a lot of physical activity.  Patient also takes care of her mom that needs physical assistance.  NOW:  Patient pain decreased to not as intense as well as not as constant-pain at the wrist improving.  Pain continues to be at lateral epicondyle and radial wrist and thumb.  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  Patient also with pain at the volar wrist with wrist flexion extension patient deep scar adhesions at the through scopic carpal tunnel site.  She continues to demonstrate increased tightness and trigger points in the volar hand and forearm but responding well to cupping and use of Graston tools.  Patient grip and prehension strength improved greatly since start of care patient continues to respond well to paraffin. Patient with no pain reported at rest.    She would benefit from additional activity modification to work tasks to decrease activity demands and  decrease pain.   Patient can benefit from skilled OT services to decrease pain and scar tissue increase motion and strength to return to prior level of function.  PERFORMANCE  DEFICITS: in functional skills including ADLs, IADLs, ROM, strength, pain, flexibility, decreased knowledge of use of DME, and UE functional use,   and psychosocial skills including environmental adaptation and routines and behaviors.   IMPAIRMENTS: are limiting patient from ADLs, IADLs, rest and sleep, play, leisure, and social participation.   COMORBIDITIES: has no other co-morbidities that affects occupational performance. Patient will benefit from skilled OT to address above impairments and improve overall function.  MODIFICATION OR ASSISTANCE TO COMPLETE EVALUATION: No modification of tasks or assist necessary to complete an evaluation.  OT OCCUPATIONAL PROFILE AND HISTORY: Problem focused assessment: Including review of records relating to presenting problem.  CLINICAL DECISION MAKING: LOW - limited treatment options, no task modification necessary  REHAB POTENTIAL: Good for goals  EVALUATION COMPLEXITY: Low  PLAN:  OT FREQUENCY: 1-2x/week  OT DURATION: 6 weeks  PLANNED INTERVENTIONS: 97168 OT Re-evaluation, 97535 self care/ADL training, 02889 therapeutic exercise, 97530 therapeutic activity, 97112 neuromuscular re-education, 97140 manual therapy, 97035 ultrasound, 97010 moist heat, 97010 cryotherapy, 97034 contrast bath, 97033 iontophoresis, 97760 Orthotic Initial, 97763 Orthotic/Prosthetic subsequent, passive range of motion, patient/family education, and DME and/or AE instructions  CONSULTED AND AGREED WITH PLAN OF CARE: Patient   Ancel Peters, OTR/L,CLT 08/04/2024, 4:25 PM

## 2024-08-06 ENCOUNTER — Ambulatory Visit: Payer: PRIVATE HEALTH INSURANCE | Admitting: Occupational Therapy

## 2024-08-06 DIAGNOSIS — M25531 Pain in right wrist: Secondary | ICD-10-CM

## 2024-08-06 DIAGNOSIS — M79641 Pain in right hand: Secondary | ICD-10-CM

## 2024-08-06 DIAGNOSIS — M6281 Muscle weakness (generalized): Secondary | ICD-10-CM

## 2024-08-06 DIAGNOSIS — L905 Scar conditions and fibrosis of skin: Secondary | ICD-10-CM

## 2024-08-06 DIAGNOSIS — M25521 Pain in right elbow: Secondary | ICD-10-CM | POA: Diagnosis not present

## 2024-08-06 DIAGNOSIS — M25631 Stiffness of right wrist, not elsewhere classified: Secondary | ICD-10-CM

## 2024-08-06 NOTE — Therapy (Signed)
 OUTPATIENT OCCUPATIONAL THERAPY ORTHO TREATMENT  Patient Name: Valerie Pearson MRN: [id number] DOB:02/07/1981, 43 y.o., female Today's Date: 08/06/2024  PCP: Dr Idelia REFERRING PROVIDER: Kip PA  END OF SESSION:  OT End of Session - 08/06/24 1232     Visit Number 8    Number of Visits 12    Date for OT Re-Evaluation 08/20/24    OT Start Time 1118    OT Stop Time 1225    OT Time Calculation (min) 67 min    Activity Tolerance Patient tolerated treatment well    Behavior During Therapy [facility] for tasks assessed/performed          Past Medical History:  Diagnosis Date   Anxiety    Cervical radiculopathy    Depression    GERD (gastroesophageal reflux disease)    Hypothyroidism    Iron deficiency anemia due to chronic blood loss    Migraines    Mild obstructive sleep apnea    Pre-diabetes    Right carpal tunnel syndrome 09/2023   Uterine fibroid    Uterine fibroid    Vitamin D insufficiency    Past Surgical History:  Procedure Laterality Date   CARPAL TUNNEL RELEASE Left 06/08/2020   Procedure: CARPAL TUNNEL RELEASE ENDOSCOPIC;  Surgeon: Edie Norleen PARAS, MD;  Location: [facility] ORS;  Service: Orthopedics;  Laterality: Left;   CARPAL TUNNEL RELEASE Right 10/29/2023   Procedure: RIGHT CARPAL TUNNEL RELEASE WITH ULTRASOUND GUIDANCE;  Surgeon: Claudene Penne ORN, MD;  Location: [facility] ORS;  Service: Neurosurgery;  Laterality: Right;   CERVICAL DISC ARTHROPLASTY N/A 10/29/2022   Procedure: C6-7 ARTHROPLASTY;  Surgeon: Clois Fret, MD;  Location: [facility] OR[facility];  Service: Neurosurgery;  Laterality: N/A;   CHOLECYSTECTOMY  2006   COLONOSCOPY  2013, 2018   COLONOSCOPY WITH PROPOFOL  N/A 11/13/2023   Procedure: COLONOSCOPY WITH PROPOFOL ;  Surgeon: Therisa Bi, MD;  Location: [facility] END[facility]COPY;  Service: Gastroenterology;  Laterality: N/A;   DILATION AND CURETTAGE OF UTERUS  2014   ESOPHAGOGASTRODUODENOSCOPY  2018   ESOPHAGOGASTRODUODENOSCOPY  07/23/2022   OPERATIVE ULTRASOUND Right  10/29/2023   Procedure: OPERATIVE ULTRASOUND;  Surgeon: Claudene Penne ORN, MD;  Location: [facility] ORS;  Service: Neurosurgery;  Laterality: Right;   TUBAL LIGATION  2016   Patient Active Problem List   Diagnosis Date Noted   Pillar pain, post-operative 12/11/2023   Family history of colon cancer in father 11/13/2023   Bilateral carpal tunnel syndrome 09/25/2023   Right arm weakness 10/29/2022   Radicular syndrome of upper limbs 10/29/2022   Impaired fasting glucose 07/27/2022   Cervical radiculopathy 07/26/2022   Hypothyroidism    Depression     ONSET DATE: June 25  REFERRING DIAG: R lateral epicondylitis  THERAPY DIAG:  Pain in right elbow  Pain in right hand  Pain in right wrist  Scar tissue  Muscle weakness (generalized)  Stiffness of right wrist, not elsewhere classified  Rationale for Evaluation and Treatment: Rehabilitation  SUBJECTIVE:   SUBJECTIVE STATEMENT: My arm is about the same as last time - The pain is not as constant and high but the day I work it is higher by the end of the day - My wrist is better and my motion - but the elbow is about the same - I am going to be out of work end of Dec for surgery  Pt accompanied by: self  PERTINENT HISTORY: Ortho visit 06/10/24:  Impression: Lateral epicondylitis of right elbow [M77.11] Lateral epicondylitis of right elbow (primary encounter diagnosis) Chronic elbow pain,  right  Plan:  1. Treatment options were discussed today with the patient. 2. The patient presents today for repeat evaluation of ongoing right lateral epicondylitis. 3. The patient does feel that the pain in the right elbow although it has returned is not as severe as it was prior to her injection. 4. she will continue with the etodolac twice daily. Tizanidine  as needed for muscle spasms. 5. She will proceed with formal occupational therapy at this time. 6. The patient will follow-up with me in 6 weeks for repeat evaluation, we will discuss possible  repeat injection versus potentially discussion of surgery.   PRECAUTIONS: None    WEIGHT BEARING RESTRICTIONS: No  PAIN:  Are you having pain?  6-8/10 at the elbow lat epicondyle - thumb and radial hand 6/10 FALLS: Has patient fallen in last 6 months? 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  Also taking care of her mom that had hip fracture.  Watch TV and do her own housework  PATIENT GOALS: I want the pain better in my right arm and hand that I can feel better and do more things  NEXT MD VISIT: 6 weeks  OBJECTIVE:  Note: Objective measures were completed at Evaluation unless otherwise noted.  HAND DOMINANCE: Left  ADLs: Pain with all activities at work from stocking shelves to opening boxes to using box cutter to use doing cashier job as well as helping her mom with transfers and bathing her.  Also with cooking and cleaning and laundry  FUNCTIONAL OUTCOME MEASURES: Extension UPPER EXTREMITY ROM:     Active ROM Right eval Left eval  Shoulder flexion    Shoulder abduction    Shoulder adduction    Shoulder extension    Shoulder internal rotation    Shoulder external rotation    Elbow flexion    Elbow extension    Wrist flexion Pain    Wrist extension pain   Wrist ulnar deviation pain   Wrist radial deviation    Wrist pronation    Wrist supination    (Blank rows = not tested)  Active ROM Right eval Left eval  Thumb MCP (0-60)    Thumb IP (0-80)    Thumb Radial abd/add (0-55) 55 pain     Thumb Palmar abd/add (0-45) 55 pain     Thumb Opposition to Small Finger  Base of 5th -pain to 4th , 5th and DPC     Index MCP (0-90)     Index PIP (0-100)     Index DIP (0-70)      Long MCP (0-90)      Long PIP (0-100)      Long DIP (0-70)      Ring MCP (0-90)      Ring PIP (0-100)      Ring DIP (0-70)      Little MCP (0-90)      Little PIP  (0-100)      Little DIP (0-70)      (Blank rows = not tested)   HAND FUNCTION: Grip strength: Right: 27 extended arm 30  lbs; Left: 75 extended arm 45  lbs, Lateral pinch: Right: 14 lbs, Left: 16 lbs, and 3 point pinch: Right: 9 lbs, Left: 17 lbs 07/14/24 Grip strength: Right: 40 extended arm 30  lbs; Left: 55 extended arm 50  lbs, Lateral pinch: Right: 14 lbs, Left: 16  lbs, and 3 point pinch: Right: 9 lbs, Left: 17 lbs 07/23/24 Grip strength: Right: 40 extended arm 30  lbs; Left: 55 extended arm 50  lbs, Lateral pinch: Right: 17 lbs, Left: 16 lbs, and 3 point pinch: Right:  11 lbs, Left: 17 lbs  COORDINATION: Within functional limits with pain with thumb mobilization manipulating small objects palm to fingertips, pain with 3-point pinch and opening packages a  SENSATION: No sensory issues mentioned but will rate continue to assess  EDEMA: Minimal  COGNITION: Overall cognitive status: Within functional limits for tasks assessed    TREATMENT DATE: 08/06/24      Patient continues to be tender over the lateral epicondyle on the right. As well as thumb CMC and 1st dorsal compartment  As well as scar adhesion at the volar right wrist carpal tunnel.  Were arthroscopic was done.   This date coming in pain with resistance wrist flexion -   During work day at [employer] has increase pain at  radial hand /thumb CMC and lat epicondyle by end of day and during the day Discuss with pt progress elbow pain limited by her work activities-    Paraffin:  Paraffin applied to right hand and wrist  to decrease pain, stiffness and to increase motion at the digits and wrist prior to manual therapy.  Heat at the same time at the elbow during paraffin Time: 6 minutes  Location: Right elbow lateral epicondyle forearm  Decrease pain and increase flexibility prior to review of home exercises and stretches   Manual Therapy and Therapeutic Exercises Following paraffin, pt was seen for manual therapy with  emphasis on carpal and metacarpal spreads, soft tissue massage and mobilization to the webspace in combination with thumb palmar radial abduction and wrist extension.    With increased motion as well as decreased pain and discomfort Also done static clip for trigger point release and thumb webspace 2 x 1 minute  Focus today on scar mobilization at the volar carpal tunnel scar.  Deep and adhere.  Used coban piece for scar mobs with wrist in extention - add to HEP and review tech with - demo and verbalize understanding   Patient to continue with home program of soft tissue mobilization over right roller for volar forearm wrist and palm to decrease trigger points and increased blood flow prior to range of motion Continued use of counterforce brace, benik neoprene wrap with thumb piece for the wrist  Recommend pt continue to analyze work tasks and find alternative ways to modify tasks to decrease activity demands on her UE.   Iontophoresis with dexamethasone  performed with a medium patch on right lateral epicondyle, 1.8 current - 21 minutes-  Patient was educated in iontophoresis Skin check done prior to session.  Tolerated well today.  Patient to keep patch on for hour after leaving     PATIENT EDUCATION: Education details: findings of eval and HEP  Person educated: Patient Education method: Explanation, Demonstration, Tactile cues, Verbal cues, and Handouts Education comprehension: verbalized understanding, returned demonstration, verbal cues required, and needs further education    GOALS: Goals reviewed with patient? Yes    LONG TERM GOALS: Target date: 8 wks   Patient to be independent in home program performing modalities as well as correct braces and splints to decrease pain at rest to less than 2/10. Baseline: Pain 7-8/10 coming in today.  At elbow and wrist.  Wearing counterforce strap but not always helpful.-Patient with increased wrist pain and thumb pain and elbow  pain. Goal  status: INITIAL  2.  Patient is right thumb active range of motion and palmar radial abduction as well as opposition symptom-free to retrieve objects out of palm and opening small packages symptom-free Baseline: Patient pain with thumb opposition to 4th and 5th as well as distal palmar crease also pain with palmar radial abduction endrange on radial side into the palm.  Patient had carpal tunnel release in December 24. Goal status: INITIAL  3.  Right wrist pain improved for patient to perform active range of motion in all planes symptom-free to initiate strengthening Baseline: Pain with wrist flexion extension 6/10 mostly at volar wrist and over carpal tunnel scar -as well as pain with ulnar deviation. Goal status: INITIAL  4.  Pain at lateral epicondyle improved for patient to initiate strengthening protocol. Baseline: Pain 7-8/10 with tenderness lateral epicondyle and positive test for third digit extension as well as wrist extension with resistance Goal status: INITIAL  5.  Right grip strength improved to within normal range for age symptom-free for patient to perform ADLs  Baseline: Grip decreased right 27 pounds with extended arm 30 pounds, left 75 with extended arm 45.  Pain 7-8/10 at the lateral epicondyle and tenderness Goal status: INITIAL  6.  Patient verbalized and demonstrated implementing modifications and joint protection to decrease pain at lateral condyle as well as future flareups. Baseline: No knowledge about activity modifications or activities that cause lateral condyle Goal status: INITIAL  ASSESSMENT:  CLINICAL IMPRESSION: Patient seen for occupational therapy for right lateral epicondylitis.  Patient report had some symptoms last year and again earlier this year.  Shots was successful last year but not earlier this year.  Patient's pain 7-8/10 with tenderness of the lateral epicondyle.  Pt also with increased pain at the wrist as well as the thumb.  Patient had  cervical surgery December 23.  Patient had a right carpal tunnel release December 24.  Patient works at Production designer, theatre/television/film at [employer].  That does a lot of physical activity.  Patient also takes care of her mom that needs physical assistance.  NOW: Discussed with patient progress in 8 visits.  Patient showed decreased pain at the wrist and hand as well as increased motion in the wrist and forearm and hand as well as increased grip and prehension strength since start of care.  Patient progress at lateral condyle is limited by her work activities.  Being very physical.  Patient to contact orthopedics for possible shot to the lateral epicondyle and will follow her 1 time a week to biweekly to address increased motion and strength as well as functional use without increasing her symptoms.  Addressing modifications and joint protection and splint use.  She would benefit from additional activity modification to work tasks to decrease activity demands and decrease pain.   Patient can benefit from skilled OT services to decrease pain and scar tissue increase motion and strength to return to prior level of function.  PERFORMANCE DEFICITS: in functional skills including ADLs, IADLs, ROM, strength, pain, flexibility, decreased knowledge of use of DME, and UE functional use,   and psychosocial skills including environmental adaptation and routines and behaviors.   IMPAIRMENTS: are limiting patient from ADLs, IADLs, rest and sleep, play, leisure, and social participation.   COMORBIDITIES: has no other co-morbidities that affects occupational performance. Patient will benefit from skilled OT to address above impairments and improve overall function.  MODIFICATION OR ASSISTANCE TO COMPLETE EVALUATION: No modification of tasks or assist necessary to  complete an evaluation.  OT OCCUPATIONAL PROFILE AND HISTORY: Problem focused assessment: Including review of records relating to presenting problem.  CLINICAL DECISION  MAKING: LOW - limited treatment options, no task modification necessary  REHAB POTENTIAL: Good for goals  EVALUATION COMPLEXITY: Low  PLAN:  OT FREQUENCY: 1-2x/week  OT DURATION: 6 weeks  PLANNED INTERVENTIONS: 97168 OT Re-evaluation, 97535 self care/ADL training, 02889 therapeutic exercise, 97530 therapeutic activity, 97112 neuromuscular re-education, 97140 manual therapy, 97035 ultrasound, 97010 moist heat, 97010 cryotherapy, 97034 contrast bath, 97033 iontophoresis, 97760 Orthotic Initial, 97763 Orthotic/Prosthetic subsequent, passive range of motion, patient/family education, and DME and/or AE instructions  CONSULTED AND AGREED WITH PLAN OF CARE: Patient   Valerie Pearson, OTR/L,CLT 08/06/2024, 12:34 PM

## 2024-08-11 ENCOUNTER — Ambulatory Visit: Payer: PRIVATE HEALTH INSURANCE | Admitting: Occupational Therapy

## 2024-08-11 DIAGNOSIS — M25521 Pain in right elbow: Secondary | ICD-10-CM

## 2024-08-11 DIAGNOSIS — M25531 Pain in right wrist: Secondary | ICD-10-CM

## 2024-08-11 DIAGNOSIS — L905 Scar conditions and fibrosis of skin: Secondary | ICD-10-CM

## 2024-08-11 DIAGNOSIS — M25631 Stiffness of right wrist, not elsewhere classified: Secondary | ICD-10-CM

## 2024-08-11 DIAGNOSIS — M6281 Muscle weakness (generalized): Secondary | ICD-10-CM

## 2024-08-11 DIAGNOSIS — M79641 Pain in right hand: Secondary | ICD-10-CM

## 2024-08-11 NOTE — Therapy (Signed)
 OUTPATIENT OCCUPATIONAL THERAPY ORTHO TREATMENT  Patient Name: Valerie Pearson MRN: [id number] DOB:1981-04-08, 43 y.o., female Today's Date: 08/11/2024  PCP: Dr Idelia REFERRING PROVIDER: Kip PA  END OF SESSION:  OT End of Session - 08/11/24 1457     Visit Number 9    Number of Visits 12    Date for OT Re-Evaluation 08/20/24    OT Start Time 1457    OT Stop Time 1547    OT Time Calculation (min) 50 min    Activity Tolerance Patient tolerated treatment well    Behavior During Therapy [facility] for tasks assessed/performed          Past Medical History:  Diagnosis Date   Anxiety    Cervical radiculopathy    Depression    GERD (gastroesophageal reflux disease)    Hypothyroidism    Iron deficiency anemia due to chronic blood loss    Migraines    Mild obstructive sleep apnea    Pre-diabetes    Right carpal tunnel syndrome 09/2023   Uterine fibroid    Uterine fibroid    Vitamin D insufficiency    Past Surgical History:  Procedure Laterality Date   CARPAL TUNNEL RELEASE Left 06/08/2020   Procedure: CARPAL TUNNEL RELEASE ENDOSCOPIC;  Surgeon: Edie Norleen PARAS, MD;  Location: [facility] ORS;  Service: Orthopedics;  Laterality: Left;   CARPAL TUNNEL RELEASE Right 10/29/2023   Procedure: RIGHT CARPAL TUNNEL RELEASE WITH ULTRASOUND GUIDANCE;  Surgeon: Claudene Penne ORN, MD;  Location: [facility] ORS;  Service: Neurosurgery;  Laterality: Right;   CERVICAL DISC ARTHROPLASTY N/A 10/29/2022   Procedure: C6-7 ARTHROPLASTY;  Surgeon: Clois Fret, MD;  Location: [facility] OR[facility];  Service: Neurosurgery;  Laterality: N/A;   CHOLECYSTECTOMY  2006   COLONOSCOPY  2013, 2018   COLONOSCOPY WITH PROPOFOL  N/A 11/13/2023   Procedure: COLONOSCOPY WITH PROPOFOL ;  Surgeon: Therisa Bi, MD;  Location: [facility] END[facility]COPY;  Service: Gastroenterology;  Laterality: N/A;   DILATION AND CURETTAGE OF UTERUS  2014   ESOPHAGOGASTRODUODENOSCOPY  2018   ESOPHAGOGASTRODUODENOSCOPY  07/23/2022   OPERATIVE ULTRASOUND Right  10/29/2023   Procedure: OPERATIVE ULTRASOUND;  Surgeon: Claudene Penne ORN, MD;  Location: [facility] ORS;  Service: Neurosurgery;  Laterality: Right;   TUBAL LIGATION  2016   Patient Active Problem List   Diagnosis Date Noted   Pillar pain, post-operative 12/11/2023   Family history of colon cancer in father 11/13/2023   Bilateral carpal tunnel syndrome 09/25/2023   Right arm weakness 10/29/2022   Radicular syndrome of upper limbs 10/29/2022   Impaired fasting glucose 07/27/2022   Cervical radiculopathy 07/26/2022   Hypothyroidism    Depression     ONSET DATE: June 25  REFERRING DIAG: R lateral epicondylitis  THERAPY DIAG:  Pain in right elbow  Pain in right hand  Pain in right wrist  Scar tissue  Muscle weakness (generalized)  Stiffness of right wrist, not elsewhere classified  Rationale for Evaluation and Treatment: Rehabilitation  SUBJECTIVE:   SUBJECTIVE STATEMENT: After last time - I did not unload the truck or stock with shelves with heavy items - because I need to take care of my elbow and hand Pt accompanied by: self  PERTINENT HISTORY: Ortho visit 06/10/24:  Impression: Lateral epicondylitis of right elbow [M77.11] Lateral epicondylitis of right elbow (primary encounter diagnosis) Chronic elbow pain, right  Plan:  1. Treatment options were discussed today with the patient. 2. The patient presents today for repeat evaluation of ongoing right lateral epicondylitis. 3. The patient does feel that the pain  in the right elbow although it has returned is not as severe as it was prior to her injection. 4. she will continue with the etodolac twice daily. Tizanidine  as needed for muscle spasms. 5. She will proceed with formal occupational therapy at this time. 6. The patient will follow-up with me in 6 weeks for repeat evaluation, we will discuss possible repeat injection versus potentially discussion of surgery.   PRECAUTIONS: None    WEIGHT BEARING RESTRICTIONS:  No  PAIN:  Are you having pain?  2/10  R lat epicondyle and thumb thenar eminence FALLS: Has patient fallen in last 6 months? 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  Also taking care of her mom that had hip fracture.  Watch TV and do her own housework  PATIENT GOALS: I want the pain better in my right arm and hand that I can feel better and do more things  NEXT MD VISIT: 6 weeks  OBJECTIVE:  Note: Objective measures were completed at Evaluation unless otherwise noted.  HAND DOMINANCE: Left  ADLs: Pain with all activities at work from stocking shelves to opening boxes to using box cutter to use doing cashier job as well as helping her mom with transfers and bathing her.  Also with cooking and cleaning and laundry  FUNCTIONAL OUTCOME MEASURES: Extension UPPER EXTREMITY ROM:     Active ROM Right eval Left eval  Shoulder flexion    Shoulder abduction    Shoulder adduction    Shoulder extension    Shoulder internal rotation    Shoulder external rotation    Elbow flexion    Elbow extension    Wrist flexion Pain    Wrist extension pain   Wrist ulnar deviation pain   Wrist radial deviation    Wrist pronation    Wrist supination    (Blank rows = not tested)  Active ROM Right eval Left eval  Thumb MCP (0-60)    Thumb IP (0-80)    Thumb Radial abd/add (0-55) 55 pain     Thumb Palmar abd/add (0-45) 55 pain     Thumb Opposition to Small Finger  Base of 5th -pain to 4th , 5th and DPC     Index MCP (0-90)     Index PIP (0-100)     Index DIP (0-70)      Long MCP (0-90)      Long PIP (0-100)      Long DIP (0-70)      Ring MCP (0-90)      Ring PIP (0-100)      Ring DIP (0-70)      Little MCP (0-90)      Little PIP (0-100)      Little DIP (0-70)      (Blank rows = not tested)   HAND FUNCTION: Grip strength: Right: 27 extended arm 30  lbs;  Left: 75 extended arm 45  lbs, Lateral pinch: Right: 14 lbs, Left: 16 lbs, and 3 point pinch: Right: 9 lbs, Left: 17 lbs 07/14/24 Grip strength: Right: 40 extended arm 30  lbs; Left: 55 extended arm 50  lbs, Lateral pinch: Right: 14 lbs, Left: 16 lbs, and 3 point pinch: Right: 9 lbs, Left: 17 lbs 07/23/24 Grip strength: Right: 40 extended arm 30  lbs; Left: 55 extended arm 50  lbs, Lateral pinch: Right: 17 lbs, Left: 16 lbs, and  3 point pinch: Right:  11 lbs, Left: 17 lbs  COORDINATION: Within functional limits with pain with thumb mobilization manipulating small objects palm to fingertips, pain with 3-point pinch and opening packages a  SENSATION: No sensory issues mentioned but will rate continue to assess  EDEMA: Minimal  COGNITION: Overall cognitive status: Within functional limits for tasks assessed    TREATMENT DATE: 08/11/24     Pt coming in with less pain today compare to last visit Patient continues to be tender over the lateral epicondyle on the right. As well as thumb CMC and 1st dorsal compartment But decrease to 2/10 after not unloading truck at work and stocking lighter items not heavy items    scar adhesion at the volar right wrist carpal tunnel still adhere but improving  Was arthroscopic   LAST visit discuss with pt that her  work day at [employer] cause increase pain at  radial hand /thumb CMC and lat epicondyle by end of day and during the day Discuss with pt progress elbow pain limited by her work activities-    Paraffin:  Paraffin applied to right hand and wrist  to decrease pain, stiffness and to increase motion at the digits and wrist prior to manual therapy.  Contrast at the same time at the elbow during paraffin Time: 6 minutes  Location: Right elbow lateral epicondyle forearm  Decrease pain and increase flexibility prior to review of home exercises and stretches   Manual Therapy and Therapeutic Exercises Following paraffin, pt was seen for manual  therapy with emphasis on carpal and metacarpal spreads, soft tissue massage and mobilization to the webspace in combination with thumb palmar radial abduction and wrist extension.    With increased motion as well as decreased pain and discomfort Also done static clip for trigger point release and thumb webspace 2 x 1 minute  Focus today on scar mobilization at the volar carpal tunnel scar. Was adhere and deep but improving -   Used coban piece for scar mobs with wrist in extention - add to HEP and review tech with - demo and verbalize understanding last visit  But also use xtractor some today in combination with wrist ext    Patient to continue with home program of soft tissue mobilization over right roller for volar forearm wrist and palm to decrease trigger points and increased blood flow prior to range of motion Continued use of counterforce brace, benik neoprene wrap with thumb piece for the wrist  Recommend pt continue to analyze work tasks and find alternative ways to modify tasks to decrease activity demands on her UE.   Iontophoresis with dexamethasone  performed with a medium patch on right lateral epicondyle, 1.8 current - 20 minutes-  Patient was educated in iontophoresis Skin check done prior to session.  Tolerated well today.  Patient to keep patch on for hour after leaving   Patient can check into Voltaren ointment to use.-Can check with pharmacist.  PATIENT EDUCATION: Education details: findings of eval and HEP  Person educated: Patient Education method: Explanation, Demonstration, Tactile cues, Verbal cues, and Handouts Education comprehension: verbalized understanding, returned demonstration, verbal cues required, and needs further education    GOALS: Goals reviewed with patient? Yes    LONG TERM GOALS: Target date: 8 wks   Patient to be independent in home program performing modalities as well as correct braces and splints to decrease pain at rest to less than  2/10. Baseline: Pain 7-8/10 coming in today.  At elbow and wrist.  Wearing counterforce strap but not always helpful.-Patient with increased wrist pain and thumb pain and elbow pain. Goal status: INITIAL  2.  Patient is right thumb active range of motion and palmar radial abduction as well as opposition symptom-free to retrieve objects out of palm and opening small packages symptom-free Baseline: Patient pain with thumb opposition to 4th and 5th as well as distal palmar crease also pain with palmar radial abduction endrange on radial side into the palm.  Patient had carpal tunnel release in December 24. Goal status: INITIAL  3.  Right wrist pain improved for patient to perform active range of motion in all planes symptom-free to initiate strengthening Baseline: Pain with wrist flexion extension 6/10 mostly at volar wrist and over carpal tunnel scar -as well as pain with ulnar deviation. Goal status: INITIAL  4.  Pain at lateral epicondyle improved for patient to initiate strengthening protocol. Baseline: Pain 7-8/10 with tenderness lateral epicondyle and positive test for third digit extension as well as wrist extension with resistance Goal status: INITIAL  5.  Right grip strength improved to within normal range for age symptom-free for patient to perform ADLs  Baseline: Grip decreased right 27 pounds with extended arm 30 pounds, left 75 with extended arm 45.  Pain 7-8/10 at the lateral epicondyle and tenderness Goal status: INITIAL  6.  Patient verbalized and demonstrated implementing modifications and joint protection to decrease pain at lateral condyle as well as future flareups. Baseline: No knowledge about activity modifications or activities that cause lateral condyle Goal status: INITIAL  ASSESSMENT:  CLINICAL IMPRESSION: Patient seen for occupational therapy for right lateral epicondylitis.  Patient report had some symptoms last year and again earlier this year.  Shots was  successful last year but not earlier this year.  Patient's pain 7-8/10 with tenderness of the lateral epicondyle.  Pt also with increased pain at the wrist as well as the thumb.  Patient had cervical surgery December 23.  Patient had a right carpal tunnel release December 24.  Patient works at Production designer, theatre/television/film at [employer].  That does a lot of physical activity.  Patient also takes care of her mom that needs physical assistance.  NOW: Discussed with patient that her job activities limiting her progress in her rehab for pain.  Patient showed decreased pain at the wrist and hand as well as increased motion in the wrist and forearm and hand as well as increased grip and prehension strength since start of care.  Patient progress at lateral condyle is limited by her work activities.  Being very physical.  Patient to contact orthopedics for possible shot to the lateral epicondyle and will follow her 1 time a week to biweekly to address increased motion and strength as well as functional use without increasing her symptoms.  Patient arrived today with reports of moderate fixations at work not unloading a truck and not doing the larger items on the shelves.  Patient arrived with pain 2/10 with much improvement compared to the last few sessions.  Encouraged patient to continue with modifications if possible.  Addressing modifications and joint protection and splint use.  She would benefit from additional activity modification to work tasks to decrease activity demands and decrease pain.   Patient can benefit from skilled OT services to decrease pain and scar tissue increase motion and strength to return to prior level of function.  PERFORMANCE DEFICITS: in functional skills including ADLs, IADLs, ROM, strength, pain, flexibility, decreased knowledge of use of DME, and UE  functional use,   and psychosocial skills including environmental adaptation and routines and behaviors.   IMPAIRMENTS: are limiting patient from  ADLs, IADLs, rest and sleep, play, leisure, and social participation.   COMORBIDITIES: has no other co-morbidities that affects occupational performance. Patient will benefit from skilled OT to address above impairments and improve overall function.  MODIFICATION OR ASSISTANCE TO COMPLETE EVALUATION: No modification of tasks or assist necessary to complete an evaluation.  OT OCCUPATIONAL PROFILE AND HISTORY: Problem focused assessment: Including review of records relating to presenting problem.  CLINICAL DECISION MAKING: LOW - limited treatment options, no task modification necessary  REHAB POTENTIAL: Good for goals  EVALUATION COMPLEXITY: Low  PLAN:  OT FREQUENCY: 1-2x/week  OT DURATION: 6 weeks  PLANNED INTERVENTIONS: 97168 OT Re-evaluation, 97535 self care/ADL training, 02889 therapeutic exercise, 97530 therapeutic activity, 97112 neuromuscular re-education, 97140 manual therapy, 97035 ultrasound, 97010 moist heat, 97010 cryotherapy, 97034 contrast bath, 97033 iontophoresis, 97760 Orthotic Initial, 97763 Orthotic/Prosthetic subsequent, passive range of motion, patient/family education, and DME and/or AE instructions  CONSULTED AND AGREED WITH PLAN OF CARE: Patient   Ancel Peters, OTR/L,CLT 08/11/2024, 3:30 PM

## 2024-08-13 ENCOUNTER — Ambulatory Visit: Payer: PRIVATE HEALTH INSURANCE | Admitting: Occupational Therapy

## 2024-08-13 ENCOUNTER — Encounter: Payer: Self-pay | Admitting: Occupational Therapy

## 2024-08-13 DIAGNOSIS — M25521 Pain in right elbow: Secondary | ICD-10-CM

## 2024-08-13 DIAGNOSIS — M79641 Pain in right hand: Secondary | ICD-10-CM

## 2024-08-13 NOTE — Therapy (Signed)
 OUTPATIENT OCCUPATIONAL THERAPY ORTHO TREATMENT / 10th Visit PROGRESS NOTE  Patient Name: Valerie Pearson MRN: [id number] DOB:03-23-1981, 43 y.o., female Today's Date: 08/13/2024  PCP: Dr Idelia REFERRING PROVIDER: Kip PA  END OF SESSION:  OT End of Session - 08/13/24 1451     Visit Number 10    Number of Visits 12    Date for Recertification  08/20/24    OT Start Time 1448    OT Stop Time 1530    OT Time Calculation (min) 42 min    Activity Tolerance Patient tolerated treatment well    Behavior During Therapy [facility] for tasks assessed/performed          Past Medical History:  Diagnosis Date   Anxiety    Cervical radiculopathy    Depression    GERD (gastroesophageal reflux disease)    Hypothyroidism    Iron deficiency anemia due to chronic blood loss    Migraines    Mild obstructive sleep apnea    Pre-diabetes    Right carpal tunnel syndrome 09/2023   Uterine fibroid    Uterine fibroid    Vitamin D insufficiency    Past Surgical History:  Procedure Laterality Date   CARPAL TUNNEL RELEASE Left 06/08/2020   Procedure: CARPAL TUNNEL RELEASE ENDOSCOPIC;  Surgeon: Edie Norleen PARAS, MD;  Location: [facility] ORS;  Service: Orthopedics;  Laterality: Left;   CARPAL TUNNEL RELEASE Right 10/29/2023   Procedure: RIGHT CARPAL TUNNEL RELEASE WITH ULTRASOUND GUIDANCE;  Surgeon: Claudene Penne ORN, MD;  Location: [facility] ORS;  Service: Neurosurgery;  Laterality: Right;   CERVICAL DISC ARTHROPLASTY N/A 10/29/2022   Procedure: C6-7 ARTHROPLASTY;  Surgeon: Clois Fret, MD;  Location: [facility] ORS;  Service: Neurosurgery;  Laterality: N/A;   CHOLECYSTECTOMY  2006   COLONOSCOPY  2013, 2018   COLONOSCOPY WITH PROPOFOL  N/A 11/13/2023   Procedure: COLONOSCOPY WITH PROPOFOL ;  Surgeon: Therisa Bi, MD;  Location: [facility] ENDOSCOPY;  Service: Gastroenterology;  Laterality: N/A;   DILATION AND CURETTAGE OF UTERUS  2014   ESOPHAGOGASTRODUODENOSCOPY  2018   ESOPHAGOGASTRODUODENOSCOPY  07/23/2022    OPERATIVE ULTRASOUND Right 10/29/2023   Procedure: OPERATIVE ULTRASOUND;  Surgeon: Claudene Penne ORN, MD;  Location: [facility] ORS;  Service: Neurosurgery;  Laterality: Right;   TUBAL LIGATION  2016   Patient Active Problem List   Diagnosis Date Noted   Pillar pain, post-operative 12/11/2023   Family history of colon cancer in father 11/13/2023   Bilateral carpal tunnel syndrome 09/25/2023   Right arm weakness 10/29/2022   Radicular syndrome of upper limbs 10/29/2022   Impaired fasting glucose 07/27/2022   Cervical radiculopathy 07/26/2022   Hypothyroidism    Depression     ONSET DATE: June 25  REFERRING DIAG: R lateral epicondylitis  THERAPY DIAG:  Pain in right elbow  Pain in right hand  Rationale for Evaluation and Treatment: Rehabilitation  SUBJECTIVE:   SUBJECTIVE STATEMENT: 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  Pt accompanied by: self  PERTINENT HISTORY: Ortho visit 06/10/24:  Impression: Lateral epicondylitis of right elbow [M77.11] Lateral epicondylitis of right elbow (primary encounter diagnosis) Chronic elbow pain, right  Plan:  1. Treatment options were discussed today with the patient. 2. The patient presents today for repeat evaluation of ongoing right lateral epicondylitis. 3. The patient does feel that the pain in the right elbow although it has returned is not as severe as it was prior to her injection. 4. she  will continue with the etodolac twice daily. Tizanidine  as needed for muscle spasms. 5. She will proceed with formal occupational therapy at this time. 6. The patient will follow-up with me in 6 weeks for repeat evaluation, we will discuss possible repeat injection versus potentially discussion of surgery.   PRECAUTIONS: None    WEIGHT BEARING RESTRICTIONS: No  PAIN:  Are you having pain?  5/10  R lat epicondyle and 7/10 pain at neck  FALLS: Has patient fallen  in last 6 months? 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  Also taking care of her mom that had hip fracture.  Watch TV and do her own housework  PATIENT GOALS: I want the pain better in my right arm and hand that I can feel better and do more things  NEXT MD VISIT: 6 weeks  OBJECTIVE:  Note: Objective measures were completed at Evaluation unless otherwise noted.  HAND DOMINANCE: Left  ADLs: Pain with all activities at work from stocking shelves to opening boxes to using box cutter to use doing cashier job as well as helping her mom with transfers and bathing her.  Also with cooking and cleaning and laundry  FUNCTIONAL OUTCOME MEASURES: Extension UPPER EXTREMITY ROM:     Active ROM Right eval Left eval  Shoulder flexion    Shoulder abduction    Shoulder adduction    Shoulder extension    Shoulder internal rotation    Shoulder external rotation    Elbow flexion    Elbow extension    Wrist flexion Pain    Wrist extension pain   Wrist ulnar deviation pain   Wrist radial deviation    Wrist pronation    Wrist supination    (Blank rows = not tested)  Active ROM Right eval Right 08/13/24 Left eval  Thumb MCP (0-60)     Thumb IP (0-80)     Thumb Radial abd/add (0-55) 55 pain      Thumb Palmar abd/add (0-45) 55 pain      Thumb Opposition to Small Finger  Base of 5th -pain to 4th , 5th and DPC  5th to [facility] 1/10 pain    Index MCP (0-90)      Index PIP (0-100)      Index DIP (0-70)       Long MCP (0-90)       Long PIP (0-100)       Long DIP (0-70)       Ring MCP (0-90)       Ring PIP (0-100)       Ring DIP (0-70)       Little MCP (0-90)       Little PIP (0-100)       Little DIP (0-70)       (Blank rows = not tested)   HAND FUNCTION: Grip strength: Right: 27 extended arm 30  lbs; Left: 75 extended arm 45  lbs, Lateral pinch: Right: 14 lbs,  Left: 16 lbs, and 3 point pinch: Right: 9 lbs, Left: 17 lbs 07/14/24 Grip strength: Right: 40 extended arm 30  lbs; Left: 55 extended arm 50  lbs, Lateral pinch: Right: 14 lbs, Left: 16 lbs, and 3 point pinch: Right: 9 lbs, Left: 17 lbs 07/23/24 Grip strength: Right: 40 extended arm 30  lbs; Left: 55 extended arm 50  lbs, Lateral pinch: Right: 17 lbs,  Left: 16 lbs, and 3 point pinch: Right:  11 lbs, Left: 17 lbs 08/13/24 Grip strength: Right: 37 pain extended arm 25 pain limited  lbs; Left: 45 extended arm 40  lbs, Lateral pinch: Right: 17 lbs, Left: 19 lbs, and 3 point pinch: Right:  12 lbs, Left: 16 lbs  COORDINATION: Within functional limits with pain with thumb mobilization manipulating small objects palm to fingertips, pain with 3-point pinch and opening packages a  SENSATION: No sensory issues mentioned but will rate continue to assess  EDEMA: Minimal  COGNITION: Overall cognitive status: Within functional limits for tasks assessed    TREATMENT DATE: 08/13/24     Pt coming in with more pain today compare to last visit - states she was lifting objects above head at work causing neck pain Discussed work modifications including limiting overhead movements to minimize shoulder pain/referred pain - reviewed shoulder stretches Patient continues to be tender over the lateral epicondyle on the right and CMC.  Pt planning to request note from doctor for weight lifting restrictions at work.    Paraffin:  Paraffin applied to right hand and wrist  to decrease pain, stiffness and to increase motion at the digits and wrist prior to manual therapy.  Contrast at the same time at the elbow during paraffin Time: 6 minutes  Location: Right elbow lateral epicondyle forearm  Decrease pain and increase flexibility prior to review of home exercises and stretches   Manual Therapy and Therapeutic Exercises Following paraffin, pt was seen for manual therapy with emphasis on carpal and metacarpal  spreads, soft tissue massage and mobilization to the webspace in combination with thumb palmar radial abduction and wrist extension.   Focus today on scar mobilization at the volar carpal tunnel scar. Provided K-tape for continued scar mobilization at home, pt reports plan to buy K tape. Utilized  xtractor in combination with wrist ext x10 reps.  Good recall of HEP - continue soft tissue mobilization over right roller for volar forearm wrist and palm to decrease trigger points and increased blood flow prior to range of motion Continued use of counterforce brace, benik neoprene wrap with thumb piece for the wrist    Iontophoresis with dexamethasone  performed with a medium patch on right lateral epicondyle, 1.8 current - 20 minutes-  Patient was educated in iontophoresis Skin check done prior to session.  Tolerated well today.  Patient to keep patch on for hour after leaving   Patient can check into Voltaren ointment to use.-Can check with pharmacist.  PATIENT EDUCATION: Education details: findings of eval and HEP  Person educated: Patient Education method: Explanation, Demonstration, Tactile cues, Verbal cues, and Handouts Education comprehension: verbalized understanding, returned demonstration, verbal cues required, and needs further education    GOALS: Goals reviewed with patient? Yes    LONG TERM GOALS: Target date: 8 wks   Patient to be independent in home program performing modalities as well as correct braces and splints to decrease pain at rest to less than 2/10. Baseline: Pain 7-8/10 coming in today.  At elbow and wrist.  Wearing counterforce strap but not always helpful.-Patient with increased wrist pain and thumb pain and elbow pain. 10th visit: 5/10 pain Goal status: Progressing  2.  Patient is right thumb active range of motion and palmar radial abduction as well as opposition symptom-free to retrieve objects out of palm and opening small packages symptom-free Baseline:  Patient pain with thumb opposition to 4th and 5th as well as distal palmar crease also pain with  palmar radial abduction endrange on radial side into the palm.  Patient had carpal tunnel release in December 24. Goal status: Progressing  3.  Right wrist pain improved for patient to perform active range of motion in all planes symptom-free to initiate strengthening Baseline: Pain with wrist flexion extension 6/10 mostly at volar wrist and over carpal tunnel scar -as well as pain with ulnar deviation. 10th visit: 5/10 pain with ulnar deviation Goal status: Progressing  4.  Pain at lateral epicondyle improved for patient to initiate strengthening protocol. Baseline: Pain 7-8/10 with tenderness lateral epicondyle and positive test for third digit extension as well as wrist extension with resistance.  10th visit: 5/10 pain Goal status: Progressing  5.  Right grip strength improved to within normal range for age symptom-free for patient to perform ADLs  Baseline: Grip decreased right 27 pounds with extended arm 30 pounds, left 75 with extended arm 45.  Pain 7-8/10 at the lateral epicondyle and tenderness.  10th visit: R grip 37 # with 5/10 pain, grip arm extended 25# 7/10 pain  Goal status: Progressing  6.  Patient verbalized and demonstrated implementing modifications and joint protection to decrease pain at lateral condyle as well as future flareups. Baseline: No knowledge about activity modifications or activities that cause lateral condyle. Goal status: Met  ASSESSMENT:  CLINICAL IMPRESSION: Patient seen for occupational therapy for right lateral epicondylitis.  Patient report had some symptoms last year and again earlier this year.  Shots was successful last year but not earlier this year.  Patient's pain 7-8/10 with tenderness of the lateral epicondyle.  Pt also with increased pain at the wrist as well as the thumb.  Patient had cervical surgery December 23.  Patient had a right carpal tunnel  release December 24.  Patient works at Production designer, theatre/television/film at [employer].  That does a lot of physical activity.  Patient also takes care of her mom that needs physical assistance.   NOW: Pt coming in with more pain today compare to last visit 5/10 R lateral epicondyle - states she was lifting objects above head at work causing 7/10 neck pain. Plan to request weight lifting restriction from MD. Discussed work modifications including limiting overhead movements to minimize shoulder pain/referred pain - reviewed shoulder stretches. Pain from work activities limiting progress. Noted decreased grip strength from prior session - pt reports overworking at her job and assisting with father in law's recent passing. Educated on work modifications and joint protection and splint use.  She would benefit from additional activity modification to work tasks to decrease activity demands and decrease pain.   Patient can benefit from skilled OT services to decrease pain and scar tissue increase motion and strength to return to prior level of function.  PERFORMANCE DEFICITS: in functional skills including ADLs, IADLs, ROM, strength, pain, flexibility, decreased knowledge of use of DME, and UE functional use,   and psychosocial skills including environmental adaptation and routines and behaviors.   IMPAIRMENTS: are limiting patient from ADLs, IADLs, rest and sleep, play, leisure, and social participation.   COMORBIDITIES: has no other co-morbidities that affects occupational performance. Patient will benefit from skilled OT to address above impairments and improve overall function.  MODIFICATION OR ASSISTANCE TO COMPLETE EVALUATION: No modification of tasks or assist necessary to complete an evaluation.  OT OCCUPATIONAL PROFILE AND HISTORY: Problem focused assessment: Including review of records relating to presenting problem.  CLINICAL DECISION MAKING: LOW - limited treatment options, no task modification  necessary  REHAB POTENTIAL:  Good for goals  EVALUATION COMPLEXITY: Low  PLAN:  OT FREQUENCY: 1-2x/week  OT DURATION: 6 weeks  PLANNED INTERVENTIONS: 97168 OT Re-evaluation, 97535 self care/ADL training, 02889 therapeutic exercise, 97530 therapeutic activity, 97112 neuromuscular re-education, 97140 manual therapy, 97035 ultrasound, 97010 moist heat, 97010 cryotherapy, 97034 contrast bath, 97033 iontophoresis, 97760 Orthotic Initial, 97763 Orthotic/Prosthetic subsequent, passive range of motion, patient/family education, and DME and/or AE instructions  CONSULTED AND AGREED WITH PLAN OF CARE: Patient   Elston JINNY Slot, OTR/L  08/13/2024, 2:52 PM

## 2024-09-08 ENCOUNTER — Other Ambulatory Visit: Payer: Self-pay | Admitting: Student

## 2024-09-08 DIAGNOSIS — M4316 Spondylolisthesis, lumbar region: Secondary | ICD-10-CM

## 2024-09-08 DIAGNOSIS — G8929 Other chronic pain: Secondary | ICD-10-CM

## 2024-09-08 DIAGNOSIS — M47816 Spondylosis without myelopathy or radiculopathy, lumbar region: Secondary | ICD-10-CM

## 2024-09-12 ENCOUNTER — Ambulatory Visit
Admission: RE | Admit: 2024-09-12 | Discharge: 2024-09-12 | Disposition: A | Discharge status: Home / Self Care | Source: Ambulatory Visit | Attending: Student | Admitting: Student

## 2024-09-12 DIAGNOSIS — M4316 Spondylolisthesis, lumbar region: Secondary | ICD-10-CM | POA: Diagnosis present

## 2024-09-12 DIAGNOSIS — M5441 Lumbago with sciatica, right side: Secondary | ICD-10-CM | POA: Insufficient documentation

## 2024-09-12 DIAGNOSIS — M47816 Spondylosis without myelopathy or radiculopathy, lumbar region: Secondary | ICD-10-CM | POA: Diagnosis present

## 2024-09-12 DIAGNOSIS — M5442 Lumbago with sciatica, left side: Secondary | ICD-10-CM | POA: Insufficient documentation

## 2024-09-12 DIAGNOSIS — G8929 Other chronic pain: Secondary | ICD-10-CM | POA: Insufficient documentation

## 2024-11-08 ENCOUNTER — Telehealth: Admitting: Family

## 2024-11-08 DIAGNOSIS — J029 Acute pharyngitis, unspecified: Secondary | ICD-10-CM

## 2024-11-08 MED ORDER — AMOXICILLIN 500 MG PO CAPS: 500.0000 mg | ORAL_CAPSULE | Freq: Two times a day (BID) | ORAL | 0 refills | Status: AC

## 2024-11-08 NOTE — Progress Notes (Signed)
 Virtual Visit Consent   Valerie Pearson, you are scheduled for a virtual visit with a [facility] provider today. Just as with appointments in the office, your consent must be obtained to participate. Your consent will be active for this visit and any virtual visit you may have with one of our providers in the next 365 days. If you have a MyChart account, a copy of this consent can be sent to you electronically.  As this is a virtual visit, video technology does not allow for your provider to perform a traditional examination. This may limit your provider's ability to fully assess your condition. If your provider identifies any concerns that need to be evaluated in person or the need to arrange testing (such as labs, EKG, etc.), we will make arrangements to do so. Although advances in technology are sophisticated, we cannot ensure that it will always work on either your end or our end. If the connection with a video visit is poor, the visit may have to be switched to a telephone visit. With either a video or telephone visit, we are not always able to ensure that we have a secure connection.  By engaging in this virtual visit, you consent to the provision of healthcare and authorize for your insurance to be billed (if applicable) for the services provided during this visit. Depending on your insurance coverage, you may receive a charge related to this service.  I need to obtain your verbal consent now. Are you willing to proceed with your visit today? Valerie Pearson has provided verbal consent on 11/08/2024 for a virtual visit (video or telephone). Bari Learn, FNP  Date: 11/08/2024 6:17 PM   Virtual Visit via Video Note   I, Bari Learn, connected with  Valerie Pearson  ([id number], [birth year]) on 11/08/2024 at  6:15 PM EST by a video-enabled telemedicine application and verified that I am speaking with the correct person using two identifiers.  Location: Patient: Virtual Visit  Location Patient: Home Provider: Virtual Visit Location Provider: Home Office   I discussed the limitations of evaluation and management by telemedicine and the availability of in person appointments. The patient expressed understanding and agreed to proceed.    History of Present Illness: Valerie Pearson is a 43 y.o. who identifies as a female who was assigned female at birth, and is being seen today for sore throat that started two days ago that has worsen.  HPI: Sore Throat  This is a new problem. The current episode started in the past 7 days. The problem has been gradually worsening. There has been no fever. The pain is at a severity of 6/10. The pain is moderate. Associated symptoms include congestion, coughing, headaches, swollen glands and trouble swallowing. Pertinent negatives include no ear discharge, ear pain or shortness of breath. Associated symptoms comments: White patches and redness of tonsils.    Problems:  Patient Active Problem List   Diagnosis Date Noted   Pillar pain, post-operative 12/11/2023   Family history of colon cancer in father 11/13/2023   Bilateral carpal tunnel syndrome 09/25/2023   Right arm weakness 10/29/2022   Radicular syndrome of upper limbs 10/29/2022   Impaired fasting glucose 07/27/2022   Cervical radiculopathy 07/26/2022   Hypothyroidism    Depression     Allergies: Allergies[1] Medications: Current Medications[2]  Observations/Objective: Patient is well-developed, well-nourished in no acute distress.  Resting comfortably  at home.  Head is normocephalic, atraumatic.  No labored breathing.  Speech is clear and coherent with logical content.  Patient is alert and oriented at baseline.  Throat erythemas and white patches on bilateral tonsils   Assessment and Plan: 1. Acute pharyngitis, unspecified etiology (Primary) - amoxicillin  (AMOXIL ) 500 MG capsule; Take 1 capsule (500 mg total) by mouth 2 (two) times daily for 10 days.  Dispense:  20 capsule; Refill: 0  - Take meds as prescribed - Use a cool mist humidifier  -Use saline nose sprays frequently -Force fluids -For any cough or congestion  Use plain Mucinex - regular strength or max strength is fine -For fever or aces or pains- take tylenol  or ibuprofen. -Throat lozenges if help -New toothbrush in 3 days -Follow up if symptoms worsen or do not improve   Follow Up Instructions: I discussed the assessment and treatment plan with the patient. The patient was provided an opportunity to ask questions and all were answered. The patient agreed with the plan and demonstrated an understanding of the instructions.  A copy of instructions were sent to the patient via MyChart unless otherwise noted below.     The patient was advised to call back or seek an in-person evaluation if the symptoms worsen or if the condition fails to improve as anticipated.    Bari Learn, FNP    [1]  Allergies Allergen Reactions   Iodinated Contrast Media Rash   Methocarbamol  Other (See Comments)    Joint pain   Other Itching and Rash    Dermabond  [2]  Current Outpatient Medications:    amoxicillin  (AMOXIL ) 500 MG capsule, Take 1 capsule (500 mg total) by mouth 2 (two) times daily for 10 days., Disp: 20 capsule, Rfl: 0   amoxicillin -clavulanate (AUGMENTIN ) 875-125 MG tablet, Take 1 tablet by mouth every 12 (twelve) hours., Disp: 14 tablet, Rfl: 0   benzonatate  (TESSALON ) 100 MG capsule, Take 1 capsule (100 mg total) by mouth every 8 (eight) hours., Disp: 21 capsule, Rfl: 0   celecoxib (CELEBREX) 200 MG capsule, Take 200 mg by mouth 2 (two) times daily as needed for moderate pain (pain score 4-6)., Disp: , Rfl:    guaiFENesin -codeine  100-10 MG/5ML syrup, Take 5 mLs by mouth every 6 (six) hours as needed for cough., Disp: 120 mL, Rfl: 0   predniSONE  (STERAPRED UNI-PAK 21 TAB) 10 MG (21) TBPK tablet, Take by mouth daily. Take 6 tabs by mouth daily  for 1 days, then 5 tabs for 1 days, then 4  tabs for 1 days, then 3 tabs for 1 days, 2 tabs for 1 days, then 1 tab by mouth daily for 1 days, Disp: 21 tablet, Rfl: 0   solifenacin (VESICARE) 10 MG tablet, Take 10 mg by mouth daily., Disp: , Rfl:    SUMAtriptan (IMITREX) 100 MG tablet, Take by mouth., Disp: , Rfl:    tranexamic acid (LYSTEDA) 650 MG TABS tablet, Take 1,300 mg by mouth 3 (three) times daily as needed (during menstrual cycle)., Disp: , Rfl:    Vitamin D, Ergocalciferol, (DRISDOL) 1.25 MG (50000 UNIT) CAPS capsule, Take 50,000 Units by mouth once a week., Disp: , Rfl:

## 2024-11-30 ENCOUNTER — Encounter (INDEPENDENT_AMBULATORY_CARE_PROVIDER_SITE_OTHER): Payer: Self-pay

## 2025-01-01 ENCOUNTER — Other Ambulatory Visit: Payer: Self-pay | Admitting: Student

## 2025-01-01 DIAGNOSIS — G8929 Other chronic pain: Secondary | ICD-10-CM

## 2025-01-01 DIAGNOSIS — M7712 Lateral epicondylitis, left elbow: Secondary | ICD-10-CM

## 2025-01-01 DIAGNOSIS — M7711 Lateral epicondylitis, right elbow: Secondary | ICD-10-CM
